# Patient Record
Sex: Female | Born: 1975 | Race: Black or African American | Hispanic: No | Marital: Married | Smoking: Never smoker
Health system: Southern US, Community
[De-identification: ages and names within clinical notes are randomized; demographics above are authoritative.]

## PROBLEM LIST (undated history)

## (undated) DIAGNOSIS — M1990 Unspecified osteoarthritis, unspecified site: Secondary | ICD-10-CM

## (undated) DIAGNOSIS — K5792 Diverticulitis of intestine, part unspecified, without perforation or abscess without bleeding: Secondary | ICD-10-CM

## (undated) DIAGNOSIS — J45909 Unspecified asthma, uncomplicated: Secondary | ICD-10-CM

## (undated) DIAGNOSIS — I10 Essential (primary) hypertension: Secondary | ICD-10-CM

## (undated) HISTORY — DX: Unspecified asthma, uncomplicated: J45.909

## (undated) HISTORY — DX: Unspecified osteoarthritis, unspecified site: M19.90

## (undated) HISTORY — DX: Diverticulitis of intestine, part unspecified, without perforation or abscess without bleeding: K57.92

---

## 1998-09-10 ENCOUNTER — Other Ambulatory Visit: Admission: RE | Admit: 1998-09-10 | Discharge: 1998-09-10 | Payer: Self-pay | Admitting: Obstetrics and Gynecology

## 1999-09-29 ENCOUNTER — Other Ambulatory Visit: Admission: RE | Admit: 1999-09-29 | Discharge: 1999-09-29 | Payer: Self-pay | Admitting: *Deleted

## 2000-10-03 ENCOUNTER — Emergency Department (HOSPITAL_COMMUNITY): Admission: EM | Admit: 2000-10-03 | Discharge: 2000-10-04 | Payer: Self-pay | Admitting: Emergency Medicine

## 2000-10-04 ENCOUNTER — Encounter: Payer: Self-pay | Admitting: Emergency Medicine

## 2000-11-07 ENCOUNTER — Other Ambulatory Visit: Admission: RE | Admit: 2000-11-07 | Discharge: 2000-11-07 | Payer: Self-pay | Admitting: *Deleted

## 2000-11-07 ENCOUNTER — Other Ambulatory Visit: Admission: RE | Admit: 2000-11-07 | Discharge: 2000-11-07 | Payer: Self-pay | Admitting: Family Medicine

## 2001-07-26 ENCOUNTER — Other Ambulatory Visit: Admission: RE | Admit: 2001-07-26 | Discharge: 2001-07-26 | Payer: Self-pay | Admitting: Obstetrics and Gynecology

## 2002-01-04 ENCOUNTER — Other Ambulatory Visit: Admission: RE | Admit: 2002-01-04 | Discharge: 2002-01-04 | Payer: Self-pay | Admitting: *Deleted

## 2003-01-31 ENCOUNTER — Other Ambulatory Visit: Admission: RE | Admit: 2003-01-31 | Discharge: 2003-01-31 | Payer: Self-pay | Admitting: *Deleted

## 2005-02-04 ENCOUNTER — Emergency Department (HOSPITAL_COMMUNITY): Admission: EM | Admit: 2005-02-04 | Discharge: 2005-02-05 | Payer: Self-pay | Admitting: Emergency Medicine

## 2005-04-08 ENCOUNTER — Encounter: Admission: RE | Admit: 2005-04-08 | Discharge: 2005-05-02 | Payer: Self-pay | Admitting: *Deleted

## 2006-05-15 ENCOUNTER — Emergency Department (HOSPITAL_COMMUNITY): Admission: EM | Admit: 2006-05-15 | Discharge: 2006-05-15 | Payer: Self-pay | Admitting: Emergency Medicine

## 2009-05-01 ENCOUNTER — Encounter: Admission: RE | Admit: 2009-05-01 | Discharge: 2009-05-01 | Payer: Self-pay | Admitting: Obstetrics and Gynecology

## 2011-04-18 ENCOUNTER — Encounter: Payer: Self-pay | Admitting: Emergency Medicine

## 2011-04-18 ENCOUNTER — Emergency Department (HOSPITAL_COMMUNITY)
Admission: EM | Admit: 2011-04-18 | Discharge: 2011-04-19 | Disposition: A | Discharge status: Home / Self Care | Payer: BC Managed Care – PPO | Attending: Emergency Medicine | Admitting: Emergency Medicine

## 2011-04-18 DIAGNOSIS — Z79899 Other long term (current) drug therapy: Secondary | ICD-10-CM | POA: Insufficient documentation

## 2011-04-18 DIAGNOSIS — R110 Nausea: Secondary | ICD-10-CM | POA: Insufficient documentation

## 2011-04-18 DIAGNOSIS — K2990 Gastroduodenitis, unspecified, without bleeding: Secondary | ICD-10-CM | POA: Insufficient documentation

## 2011-04-18 DIAGNOSIS — R109 Unspecified abdominal pain: Secondary | ICD-10-CM | POA: Insufficient documentation

## 2011-04-18 DIAGNOSIS — R10819 Abdominal tenderness, unspecified site: Secondary | ICD-10-CM | POA: Insufficient documentation

## 2011-04-18 DIAGNOSIS — R059 Cough, unspecified: Secondary | ICD-10-CM | POA: Insufficient documentation

## 2011-04-18 DIAGNOSIS — I10 Essential (primary) hypertension: Secondary | ICD-10-CM | POA: Insufficient documentation

## 2011-04-18 DIAGNOSIS — R05 Cough: Secondary | ICD-10-CM | POA: Insufficient documentation

## 2011-04-18 DIAGNOSIS — K2970 Gastritis, unspecified, without bleeding: Secondary | ICD-10-CM | POA: Insufficient documentation

## 2011-04-18 HISTORY — DX: Essential (primary) hypertension: I10

## 2011-04-18 LAB — CBC
HCT: 43.4 % (ref 36.0–46.0)
Hemoglobin: 14.5 g/dL (ref 12.0–15.0)
MCH: 30.1 pg (ref 26.0–34.0)
MCHC: 33.4 g/dL (ref 30.0–36.0)
MCV: 90 fL (ref 78.0–100.0)
Platelets: 342 10*3/uL (ref 150–400)
RBC: 4.82 MIL/uL (ref 3.87–5.11)
RDW: 13 % (ref 11.5–15.5)
WBC: 17 10*3/uL — ABNORMAL HIGH (ref 4.0–10.5)

## 2011-04-18 LAB — URINALYSIS, ROUTINE W REFLEX MICROSCOPIC
Glucose, UA: NEGATIVE mg/dL
Hgb urine dipstick: NEGATIVE
Ketones, ur: 40 mg/dL — AB
Leukocytes, UA: NEGATIVE
Nitrite: NEGATIVE
Protein, ur: 30 mg/dL — AB
Specific Gravity, Urine: 1.037 — ABNORMAL HIGH (ref 1.005–1.030)
Urobilinogen, UA: 0.2 mg/dL (ref 0.0–1.0)
pH: 5.5 (ref 5.0–8.0)

## 2011-04-18 LAB — DIFFERENTIAL
Basophils Absolute: 0 10*3/uL (ref 0.0–0.1)
Basophils Relative: 0 % (ref 0–1)
Eosinophils Absolute: 0 10*3/uL (ref 0.0–0.7)
Eosinophils Relative: 0 % (ref 0–5)
Lymphocytes Relative: 9 % — ABNORMAL LOW (ref 12–46)
Lymphs Abs: 1.5 10*3/uL (ref 0.7–4.0)
Monocytes Absolute: 0.6 10*3/uL (ref 0.1–1.0)
Monocytes Relative: 4 % (ref 3–12)
Neutro Abs: 14.9 10*3/uL — ABNORMAL HIGH (ref 1.7–7.7)
Neutrophils Relative %: 87 % — ABNORMAL HIGH (ref 43–77)

## 2011-04-18 LAB — BASIC METABOLIC PANEL
BUN: 12 mg/dL (ref 6–23)
CO2: 25 mEq/L (ref 19–32)
Calcium: 9.7 mg/dL (ref 8.4–10.5)
Chloride: 101 mEq/L (ref 96–112)
Creatinine, Ser: 0.98 mg/dL (ref 0.50–1.10)
GFR calc Af Amer: 84 mL/min — ABNORMAL LOW (ref >90)
GFR calc non Af Amer: 73 mL/min — ABNORMAL LOW (ref >90)
Glucose, Bld: 117 mg/dL — ABNORMAL HIGH (ref 70–99)
Potassium: 3.8 mEq/L (ref 3.5–5.1)
Sodium: 137 mEq/L (ref 135–145)

## 2011-04-18 LAB — URINE MICROSCOPIC-ADD ON

## 2011-04-18 LAB — POCT PREGNANCY, URINE: Preg Test, Ur: NEGATIVE

## 2011-04-18 MED ORDER — SODIUM CHLORIDE 0.9 % IV BOLUS (SEPSIS)
1000.0000 mL | Freq: Once | INTRAVENOUS | Status: AC
  Administered 2011-04-18: 1000 mL via INTRAVENOUS

## 2011-04-18 MED ORDER — SODIUM CHLORIDE 0.9 % IV SOLN: INTRAVENOUS | Status: DC

## 2011-04-18 MED ORDER — MORPHINE SULFATE 4 MG/ML IJ SOLN
4.0000 mg | Freq: Once | INTRAMUSCULAR | Status: AC
  Administered 2011-04-18: 4 mg via INTRAVENOUS
  Filled 2011-04-18: qty 1

## 2011-04-18 MED ORDER — FAMOTIDINE IN NACL 20-0.9 MG/50ML-% IV SOLN
20.0000 mg | Freq: Once | INTRAVENOUS | Status: AC
  Administered 2011-04-18: 20 mg via INTRAVENOUS
  Filled 2011-04-18: qty 50

## 2011-04-18 MED ORDER — ONDANSETRON HCL 4 MG/2ML IJ SOLN
4.0000 mg | Freq: Once | INTRAMUSCULAR | Status: AC
  Administered 2011-04-18: 4 mg via INTRAVENOUS
  Filled 2011-04-18: qty 2

## 2011-04-18 NOTE — ED Notes (Signed)
PT. REPORTS INTERMITTENT GENERALIZED ABDOMINAL PAIN /CRAMPING ONSET TODAY , DENIES NAUSEA ,VOMITTING OR DIARRHEA , NO FEVER OR CHILLS , NO DYSURIA OR HEMATURIA.

## 2011-04-19 LAB — HEPATIC FUNCTION PANEL
ALT: 8 U/L (ref 0–35)
AST: 13 U/L (ref 0–37)
Albumin: 3.5 g/dL (ref 3.5–5.2)
Alkaline Phosphatase: 58 U/L (ref 39–117)
Bilirubin, Direct: <0.1 mg/dL (ref 0.0–0.3)
Total Bilirubin: 0.5 mg/dL (ref 0.3–1.2)
Total Protein: 7.7 g/dL (ref 6.0–8.3)

## 2011-04-19 LAB — LIPASE, BLOOD: Lipase: 15 U/L (ref 11–59)

## 2011-04-19 MED ORDER — PROMETHAZINE HCL 25 MG PO TABS: 25.0000 mg | ORAL_TABLET | Freq: Three times a day (TID) | ORAL | Status: DC | PRN

## 2011-04-19 NOTE — ED Notes (Signed)
No changes, denies pain, EDP into room, family at [facility].

## 2011-04-19 NOTE — ED Provider Notes (Signed)
History     CSN: [id number] Arrival date & time: 04/18/2011  8:49 PM   First MD Initiated Contact with Patient 04/18/11 2257      Chief Complaint  Patient presents with  . Abdominal Pain    (Consider location/radiation/quality/duration/timing/severity/associated sxs/prior treatment) HPI  Patient relates about noon she started getting upper abdominal pain that's all the way across her upper abdomen that appears to be  worse on the left side. She states the pain is intermittent and lasting about a minute. She had nausea earlier not now, she denies vomiting or diarrhea. She has been having a dry cough but that is better now. she denies fever or urinary symptoms such as dysuria or frequency. She states nothing she does makes it feel better nothing, she does makes it feel worse. She denies having this discomfort before.  Primary care Dr. Claude Manges at [place] at [facility]  Past Medical History  Diagnosis Date  . Hypertension     History reviewed. No pertinent past surgical history.  History reviewed. No pertinent family history.  History  Substance Use Topics  . Smoking status: Never Smoker   . Smokeless tobacco: Not on file  . Alcohol Use: No   patient employed  OB History    Grav Para Term Preterm Abortions TAB SAB Ect Mult Living                  Review of Systems  All other systems reviewed and are negative.    Allergies  Review of patient's allergies indicates no known allergies.  Home Medications   Current Outpatient Rx  Name Route Sig Dispense Refill  . DROSPIRENONE-ETHINYL ESTRADIOL 3-0.03 MG PO TABS Oral Take 1 tablet by mouth daily.      Marland Kitchen LISINOPRIL 20 MG PO TABS Oral Take 20 mg by mouth daily.      Marland Kitchen OVER THE COUNTER MEDICATION Oral Take 1 tablet by mouth daily as needed. For upset stomach     . PHAZYME PO Oral Take 1 tablet by mouth daily as needed. For gas       BP 133/87  Pulse 90  Temp(Src) 98.7 F (37.1 C) (Oral)  Resp 18  SpO2 99%   LMP 04/10/2011 Vital signs normal  Physical Exam  Nursing note and vitals reviewed. Constitutional: She is oriented to person, place, and time. She appears well-developed and well-nourished.  Non-toxic appearance. She does not appear ill. No distress.  HENT:  Head: Normocephalic and atraumatic.  Right Ear: External ear normal.  Left Ear: External ear normal.  Nose: Nose normal. No mucosal edema or rhinorrhea.  Mouth/Throat: Oropharynx is clear and moist and mucous membranes are normal. No dental abscesses or uvula swelling.  Eyes: Conjunctivae and EOM are normal. Pupils are equal, round, and reactive to light.  Neck: Normal range of motion and full passive range of motion without pain. Neck supple.  Cardiovascular: Normal rate, regular rhythm and normal heart sounds.  Exam reveals no gallop and no friction rub.   No murmur heard. Pulmonary/Chest: Effort normal and breath sounds normal. No respiratory distress. She has no wheezes. She has no rhonchi. She has no rales. She exhibits no tenderness and no crepitus.  Abdominal: Soft. Normal appearance and bowel sounds are normal. She exhibits no distension. There is tenderness. There is no rebound and no guarding.       Patient has  diffuse tenderness to palpation of her upper abdomen there is no guarding or rebound she does indicate  he seems to be worse on the left side.  Musculoskeletal: Normal range of motion. She exhibits no edema and no tenderness.       Moves all extremities well.   Neurological: She is alert and oriented to person, place, and time. She has normal strength. No cranial nerve deficit.  Skin: Skin is warm, dry and intact. No rash noted. No erythema. No pallor.  Psychiatric: She has a normal mood and affect. Her speech is normal and behavior is normal. Her mood appears not anxious.    ED Course  Procedures (including critical care time)  Patient given IV fluids, IV pain and nausea medicine. Recheck at 02 10 she states she  feels improved infiltrate at home.   Medications  0.9 %  sodium chloride infusion (not administered)  sodium chloride 0.9 % bolus 1,000 mL (1000 mL Intravenous Given 04/18/11 2340)  famotidine (PEPCID) IVPB 20 mg (20 mg Intravenous Given 04/18/11 2345)  morphine 4 MG/ML injection 4 mg (4 mg Intravenous Given 04/18/11 2344)  ondansetron (ZOFRAN) injection 4 mg (4 mg Intravenous Given 04/18/11 2341)     Results for orders placed during the hospital encounter of 04/18/11  CBC      Component Value Range   WBC 17.0 (*) 4.0 - 10.5 (K/uL)   RBC 4.82  3.87 - 5.11 (MIL/uL)   Hemoglobin 14.5  12.0 - 15.0 (g/dL)   HCT 62.1  30.8 - 65.7 (%)   MCV 90.0  78.0 - 100.0 (fL)   MCH 30.1  26.0 - 34.0 (pg)   MCHC 33.4  30.0 - 36.0 (g/dL)   RDW 84.6  96.2 - 95.2 (%)   Platelets 342  150 - 400 (K/uL)  DIFFERENTIAL      Component Value Range   Neutrophils Relative 87 (*) 43 - 77 (%)   Neutro Abs 14.9 (*) 1.7 - 7.7 (K/uL)   Lymphocytes Relative 9 (*) 12 - 46 (%)   Lymphs Abs 1.5  0.7 - 4.0 (K/uL)   Monocytes Relative 4  3 - 12 (%)   Monocytes Absolute 0.6  0.1 - 1.0 (K/uL)   Eosinophils Relative 0  0 - 5 (%)   Eosinophils Absolute 0.0  0.0 - 0.7 (K/uL)   Basophils Relative 0  0 - 1 (%)   Basophils Absolute 0.0  0.0 - 0.1 (K/uL)  BASIC METABOLIC PANEL      Component Value Range   Sodium 137  135 - 145 (mEq/L)   Potassium 3.8  3.5 - 5.1 (mEq/L)   Chloride 101  96 - 112 (mEq/L)   CO2 25  19 - 32 (mEq/L)   Glucose, Bld 117 (*) 70 - 99 (mg/dL)   BUN 12  6 - 23 (mg/dL)   Creatinine, Ser 8.41  0.50 - 1.10 (mg/dL)   Calcium 9.7  8.4 - 32.4 (mg/dL)   GFR calc non Af Amer 73 (*) >90 (mL/min)   GFR calc Af Amer 84 (*) >90 (mL/min)  URINALYSIS, ROUTINE W REFLEX MICROSCOPIC      Component Value Range   Color, Urine AMBER (*) YELLOW    APPearance CLOUDY (*) CLEAR    Specific Gravity, Urine 1.037 (*) 1.005 - 1.030    pH 5.5  5.0 - 8.0    Glucose, UA NEGATIVE  NEGATIVE (mg/dL)   Hgb urine dipstick  NEGATIVE  NEGATIVE    Bilirubin Urine SMALL (*) NEGATIVE    Ketones, ur 40 (*) NEGATIVE (mg/dL)   Protein, ur 30 (*) NEGATIVE (mg/dL)  Urobilinogen, UA 0.2  0.0 - 1.0 (mg/dL)   Nitrite NEGATIVE  NEGATIVE    Leukocytes, UA NEGATIVE  NEGATIVE   HEPATIC FUNCTION PANEL      Component Value Range   Total Protein 7.7  6.0 - 8.3 (g/dL)   Albumin 3.5  3.5 - 5.2 (g/dL)   AST 13  0 - 37 (U/L)   ALT 8  0 - 35 (U/L)   Alkaline Phosphatase 58  39 - 117 (U/L)   Total Bilirubin 0.5  0.3 - 1.2 (mg/dL)   Bilirubin, Direct <5.6  0.0 - 0.3 (mg/dL)   Indirect Bilirubin NOT CALCULATED  0.3 - 0.9 (mg/dL)  POCT PREGNANCY, URINE      Component Value Range   Preg Test, Ur NEGATIVE    URINE MICROSCOPIC-ADD ON      Component Value Range   Squamous Epithelial / LPF FEW (*) RARE    WBC, UA 3-6  <3 (WBC/hpf)   RBC / HPF 0-2  <3 (RBC/hpf)   Bacteria, UA RARE  RARE    Urine-Other MUCOUS PRESENT    LIPASE, BLOOD      Component Value Range   Lipase 15  11 - 59 (U/L)   Laboratory interpretation leukocytosis otherwise no significant changes    Diagnoses that have been ruled out:  Diagnoses that are still under consideration:  Final diagnoses:  Gastritis   New Prescriptions   PROMETHAZINE (PHENERGAN) 25 MG TABLET    Take 1 tablet (25 mg total) by mouth every 8 (eight) hours as needed for nausea.  Prilosec OTC  Plan discharge  Devoria Albe, MD, FACEP    No diagnosis found.    MDM          Ward Givens, MD 04/19/11 [phone]

## 2011-04-25 ENCOUNTER — Emergency Department (HOSPITAL_COMMUNITY): Payer: BC Managed Care – PPO

## 2011-04-25 ENCOUNTER — Inpatient Hospital Stay (HOSPITAL_COMMUNITY)
Admission: EM | Admit: 2011-04-25 | Discharge: 2011-04-26 | DRG: 814 | Disposition: A | Discharge status: Home / Self Care | Payer: BC Managed Care – PPO | Attending: Internal Medicine | Admitting: Internal Medicine

## 2011-04-25 ENCOUNTER — Encounter (HOSPITAL_COMMUNITY): Payer: Self-pay

## 2011-04-25 DIAGNOSIS — E860 Dehydration: Secondary | ICD-10-CM | POA: Diagnosis present

## 2011-04-25 DIAGNOSIS — I10 Essential (primary) hypertension: Secondary | ICD-10-CM | POA: Diagnosis present

## 2011-04-25 DIAGNOSIS — A09 Infectious gastroenteritis and colitis, unspecified: Principal | ICD-10-CM | POA: Diagnosis present

## 2011-04-25 DIAGNOSIS — D72829 Elevated white blood cell count, unspecified: Secondary | ICD-10-CM | POA: Diagnosis present

## 2011-04-25 DIAGNOSIS — K529 Noninfective gastroenteritis and colitis, unspecified: Secondary | ICD-10-CM | POA: Diagnosis present

## 2011-04-25 LAB — COMPREHENSIVE METABOLIC PANEL
ALT: 19 U/L (ref 0–35)
AST: 12 U/L (ref 0–37)
Albumin: 3.5 g/dL (ref 3.5–5.2)
Alkaline Phosphatase: 57 U/L (ref 39–117)
BUN: 10 mg/dL (ref 6–23)
CO2: 22 mEq/L (ref 19–32)
Calcium: 9.5 mg/dL (ref 8.4–10.5)
Chloride: 101 mEq/L (ref 96–112)
Creatinine, Ser: 0.92 mg/dL (ref 0.50–1.10)
GFR calc Af Amer: >90 mL/min (ref >90)
GFR calc non Af Amer: 79 mL/min — ABNORMAL LOW (ref >90)
Glucose, Bld: 146 mg/dL — ABNORMAL HIGH (ref 70–99)
Potassium: 3.8 mEq/L (ref 3.5–5.1)
Sodium: 134 mEq/L — ABNORMAL LOW (ref 135–145)
Total Bilirubin: 0.4 mg/dL (ref 0.3–1.2)
Total Protein: 7.7 g/dL (ref 6.0–8.3)

## 2011-04-25 LAB — CBC
HCT: 42.3 % (ref 36.0–46.0)
Hemoglobin: 14.4 g/dL (ref 12.0–15.0)
MCH: 30.1 pg (ref 26.0–34.0)
MCHC: 34 g/dL (ref 30.0–36.0)
MCV: 88.3 fL (ref 78.0–100.0)
Platelets: 332 10*3/uL (ref 150–400)
RBC: 4.79 MIL/uL (ref 3.87–5.11)
RDW: 12.6 % (ref 11.5–15.5)
WBC: 15.9 10*3/uL — ABNORMAL HIGH (ref 4.0–10.5)

## 2011-04-25 LAB — URINALYSIS, ROUTINE W REFLEX MICROSCOPIC
Bilirubin Urine: NEGATIVE
Glucose, UA: NEGATIVE mg/dL
Hgb urine dipstick: NEGATIVE
Ketones, ur: 15 mg/dL — AB
Leukocytes, UA: NEGATIVE
Nitrite: NEGATIVE
Protein, ur: NEGATIVE mg/dL
Specific Gravity, Urine: 1.02 (ref 1.005–1.030)
Urobilinogen, UA: 0.2 mg/dL (ref 0.0–1.0)
pH: 5 (ref 5.0–8.0)

## 2011-04-25 LAB — DIFFERENTIAL
Basophils Absolute: 0 10*3/uL (ref 0.0–0.1)
Basophils Relative: 0 % (ref 0–1)
Eosinophils Absolute: 0 10*3/uL (ref 0.0–0.7)
Eosinophils Relative: 0 % (ref 0–5)
Lymphocytes Relative: 5 % — ABNORMAL LOW (ref 12–46)
Lymphs Abs: 0.9 10*3/uL (ref 0.7–4.0)
Monocytes Absolute: 0.5 10*3/uL (ref 0.1–1.0)
Monocytes Relative: 3 % (ref 3–12)
Neutro Abs: 14.5 10*3/uL — ABNORMAL HIGH (ref 1.7–7.7)
Neutrophils Relative %: 91 % — ABNORMAL HIGH (ref 43–77)

## 2011-04-25 LAB — LIPASE, BLOOD: Lipase: 15 U/L (ref 11–59)

## 2011-04-25 LAB — LACTIC ACID, PLASMA: Lactic Acid, Venous: 1.2 mmol/L (ref 0.5–2.2)

## 2011-04-25 LAB — POCT PREGNANCY, URINE: Preg Test, Ur: NEGATIVE

## 2011-04-25 MED ORDER — CIPROFLOXACIN IN D5W 400 MG/200ML IV SOLN
400.0000 mg | Freq: Two times a day (BID) | INTRAVENOUS | Status: DC
  Administered 2011-04-25 – 2011-04-26 (×3): 400 mg via INTRAVENOUS
  Filled 2011-04-25 (×4): qty 200

## 2011-04-25 MED ORDER — OXYCODONE HCL 5 MG PO TABS
5.0000 mg | ORAL_TABLET | ORAL | Status: DC | PRN
  Administered 2011-04-26: 5 mg via ORAL
  Filled 2011-04-25 (×2): qty 1

## 2011-04-25 MED ORDER — IOHEXOL 300 MG/ML  SOLN
20.0000 mL | INTRAMUSCULAR | Status: AC
  Administered 2011-04-25: 20 mL via ORAL

## 2011-04-25 MED ORDER — ONDANSETRON HCL 4 MG/2ML IJ SOLN
4.0000 mg | Freq: Once | INTRAMUSCULAR | Status: AC
  Administered 2011-04-25: 4 mg via INTRAVENOUS
  Filled 2011-04-25: qty 2

## 2011-04-25 MED ORDER — HYDROMORPHONE HCL PF 1 MG/ML IJ SOLN
1.0000 mg | Freq: Once | INTRAMUSCULAR | Status: AC
  Administered 2011-04-25: 1 mg via INTRAVENOUS
  Filled 2011-04-25: qty 1

## 2011-04-25 MED ORDER — SODIUM CHLORIDE 0.9 % IJ SOLN
3.0000 mL | Freq: Two times a day (BID) | INTRAMUSCULAR | Status: DC
  Administered 2011-04-25 – 2011-04-26 (×2): 3 mL via INTRAVENOUS

## 2011-04-25 MED ORDER — OMEPRAZOLE MAGNESIUM 20 MG PO TBEC
20.0000 mg | DELAYED_RELEASE_TABLET | Freq: Every day | ORAL | Status: DC
  Administered 2011-04-25 – 2011-04-26 (×2): 20 mg via ORAL
  Filled 2011-04-25 (×2): qty 1

## 2011-04-25 MED ORDER — ONDANSETRON HCL 4 MG/2ML IJ SOLN
4.0000 mg | Freq: Four times a day (QID) | INTRAMUSCULAR | Status: DC | PRN
  Administered 2011-04-26: 4 mg via INTRAVENOUS
  Filled 2011-04-25: qty 2

## 2011-04-25 MED ORDER — LISINOPRIL 20 MG PO TABS
20.0000 mg | ORAL_TABLET | Freq: Every day | ORAL | Status: DC
Start: 2011-04-25 — End: 2011-04-26
  Administered 2011-04-25 – 2011-04-26 (×2): 20 mg via ORAL
  Filled 2011-04-25 (×2): qty 1

## 2011-04-25 MED ORDER — METRONIDAZOLE IN NACL 5-0.79 MG/ML-% IV SOLN
500.0000 mg | Freq: Three times a day (TID) | INTRAVENOUS | Status: DC
  Filled 2011-04-25 (×2): qty 100

## 2011-04-25 MED ORDER — IOHEXOL 300 MG/ML  SOLN
100.0000 mL | Freq: Once | INTRAMUSCULAR | Status: AC | PRN
  Administered 2011-04-25: 100 mL via INTRAVENOUS

## 2011-04-25 MED ORDER — SODIUM CHLORIDE 0.9 % IV BOLUS (SEPSIS)
1000.0000 mL | Freq: Once | INTRAVENOUS | Status: AC
  Administered 2011-04-25: 1000 mL via INTRAVENOUS

## 2011-04-25 MED ORDER — METRONIDAZOLE IN NACL 5-0.79 MG/ML-% IV SOLN
500.0000 mg | Freq: Three times a day (TID) | INTRAVENOUS | Status: DC
  Administered 2011-04-25 – 2011-04-26 (×4): 500 mg via INTRAVENOUS
  Filled 2011-04-25 (×6): qty 100

## 2011-04-25 MED ORDER — ONDANSETRON HCL 4 MG/2ML IJ SOLN
4.0000 mg | Freq: Once | INTRAMUSCULAR | Status: AC
  Administered 2011-04-25: 4 mg via INTRAVENOUS

## 2011-04-25 MED ORDER — ONDANSETRON HCL 4 MG/2ML IJ SOLN
4.0000 mg | Freq: Once | INTRAMUSCULAR | Status: DC
  Filled 2011-04-25: qty 2

## 2011-04-25 MED ORDER — SODIUM CHLORIDE 0.9 % IV SOLN: INTRAVENOUS | Status: DC

## 2011-04-25 MED ORDER — SODIUM CHLORIDE 0.9 % IJ SOLN: 3.0000 mL | INTRAMUSCULAR | Status: DC | PRN

## 2011-04-25 MED ORDER — SODIUM CHLORIDE 0.9 % IV SOLN: 250.0000 mL | INTRAVENOUS | Status: DC | PRN

## 2011-04-25 MED ORDER — ONDANSETRON HCL 4 MG PO TABS: 4.0000 mg | ORAL_TABLET | Freq: Four times a day (QID) | ORAL | Status: DC | PRN

## 2011-04-25 MED ORDER — SODIUM CHLORIDE 0.9 % IV SOLN
Freq: Once | INTRAVENOUS | Status: AC
  Administered 2011-04-25: 08:00:00 via INTRAVENOUS

## 2011-04-25 NOTE — H&P (Signed)
PCP:  OWENS,REBECCA, FNP, FNP   DOA:  04/25/2011  3:06 AM  Chief Complaint:  abdominal pain with vomiting and diarrhea x 1 day  HPI: 35 year old female with a past medical history all for recently diagnosed hypertension who presented to the ED one week back with left mid quadrant abdominal pain of one day duration. She was given pain medications in the ED and was sent home. Was noted to have moderate leukocytosis at that time. She then went to an urgent care the next day and was informed to call PCP if symptoms persisted. He informs that her symptoms subsided however 2 days back she started having similar sharp abdominal pain. Since yesterday pain started getting worse and was having it several times during the day lasting for a few minutes and was radiating all across the abdomen. She also had 2 episodes of vomiting of food particles and 4 episodes of watery diarrhea without any blood. She denies any sick contacts, denies eating anything outside, denies being on antibiotics recently, denies recent travel, denies fever or chills, denies sick contacts , denies joint pains, no headache or blurring of vision. He denies having any similar symptoms in the past. She denies any family history all for intermittent rebound disease. The ED she was noted to be tachycardic and hypertensive and as per the ED physician she was quite tender on palpation over the abdomen. CT scan of the abdomen and pelvis in the ED showed significant wall thickening within the proximal and mid small bowel loops with mucosal enhancement, mesenteric edema and free fluid suggestive off infectious versus intermittently enteritis.  Allergies: No Known Allergies  Prior to Admission medications   Medication Sig Start Date End Date Taking? Authorizing Provider  drospirenone-ethinyl estradiol (YASMIN,ZARAH,SYEDA) 3-0.03 MG tablet Take 1 tablet by mouth daily.     Yes Historical Provider, MD  lisinopril (PRINIVIL,ZESTRIL) 20 MG tablet Take  20 mg by mouth daily.     Yes Historical Provider, MD  omeprazole (PRILOSEC OTC) 20 MG tablet Take 20 mg by mouth daily.     Yes Historical Provider, MD    Past Medical History  Diagnosis Date  . Hypertension     History reviewed. No pertinent past surgical history.  Social History:  reports that she has never smoked.  She reports that she does not drink alcohol or use illicit drugs.  No family history on file.  Review of Systems:  Constitutional: Denies fever, chills, diaphoresis, appetite change and fatigue.  HEENT: Denies photophobia, eye pain, redness, hearing loss, ear pain, congestion, sore throat, rhinorrhea, sneezing, mouth sores, trouble swallowing, neck pain, neck stiffness and tinnitus.   Respiratory: Denies SOB, DOE, cough, chest tightness,  and wheezing.   Cardiovascular: Denies chest pain, palpitations and leg swelling.  Gastrointestinal: Positive for nausea, vomiting, abdominal pain, diarrhea, denies constipation, denies blood in stool  Genitourinary: Denies dysuria, urgency, frequency, hematuria, flank pain and difficulty urinating.  Musculoskeletal: Denies myalgias, back pain, joint swelling, arthralgias and gait problem.  Skin: Denies pallor, rash and wound.  Neurological: Denies dizziness, seizures, syncope, weakness, light-headedness, numbness and headaches.  Hematological: Denies adenopathy. Easy bruising, personal or family bleeding history  Psychiatric/Behavioral: Denies suicidal ideation, mood changes, confusion, nervousness, sleep disturbance and agitation   Physical Exam:  Filed Vitals:   04/25/11 0450 04/25/11 0652 04/25/11 0930 04/25/11 0945  BP: 159/118 160/117 144/98 140/93  Pulse:   85 89  Temp: 98.7 F (37.1 C) 98.5 F (36.9 C)  97.8 F (36.6 C)  TempSrc:  Oral Oral    Resp: 18 20    SpO2: 100% 98% 99% 99%    Constitutional: Middle-aged female lying in bed in no acute distress  HEENT: No pallor, no icterus, dry oral mucosa, no  lymphadenopathy Cardiovascular: RRR, S1 normal, S2 normal, no MRG, pulses symmetric and intact bilaterally Pulmonary/Chest: CTAB, no wheezes, rales, or rhonchi Abdominal: Soft. , non-distended, bowel sounds are normal, tender to deep palpation over left mid quadrant, no masses, organomegaly, or guarding present.  GU: no CVA tenderness Musculoskeletal: No joint deformities, erythema, or stiffness, ROM full and no nontender Ext: no edema and no cyanosis, pulses palpable bilaterally (DP and PT) Hematology: no cervical, inginal, or axillary adenopathy.  Neurological: A&O x3, Strenght is normal and symmetric bilaterally, cranial nerve II-XII are grossly intact, no focal motor deficit, sensory intact to light touch bilaterally.  Skin: Warm, dry and intact. No rash, cyanosis, or clubbing.  Psychiatric: Normal mood and affect. speech and behavior is normal. Judgment and thought content normal. Cognition and memory are normal.   Labs on Admission:  Results for orders placed during the hospital encounter of 04/25/11 (from the past 48 hour(s))  CBC     Status: Abnormal   Collection Time   04/25/11  5:50 AM      Component Value Range Comment   WBC 15.9 (*) 4.0 - 10.5 (K/uL)    RBC 4.79  3.87 - 5.11 (MIL/uL)    Hemoglobin 14.4  12.0 - 15.0 (g/dL)    HCT 16.1  09.6 - 04.5 (%)    MCV 88.3  78.0 - 100.0 (fL)    MCH 30.1  26.0 - 34.0 (pg)    MCHC 34.0  30.0 - 36.0 (g/dL)    RDW 40.9  81.1 - 91.4 (%)    Platelets 332  150 - 400 (K/uL)   DIFFERENTIAL     Status: Abnormal   Collection Time   04/25/11  5:50 AM      Component Value Range Comment   Neutrophils Relative 91 (*) 43 - 77 (%)    Neutro Abs 14.5 (*) 1.7 - 7.7 (K/uL)    Lymphocytes Relative 5 (*) 12 - 46 (%)    Lymphs Abs 0.9  0.7 - 4.0 (K/uL)    Monocytes Relative 3  3 - 12 (%)    Monocytes Absolute 0.5  0.1 - 1.0 (K/uL)    Eosinophils Relative 0  0 - 5 (%)    Eosinophils Absolute 0.0  0.0 - 0.7 (K/uL)    Basophils Relative 0  0 - 1 (%)      Basophils Absolute 0.0  0.0 - 0.1 (K/uL)   COMPREHENSIVE METABOLIC PANEL     Status: Abnormal   Collection Time   04/25/11  5:50 AM      Component Value Range Comment   Sodium 134 (*) 135 - 145 (mEq/L)    Potassium 3.8  3.5 - 5.1 (mEq/L)    Chloride 101  96 - 112 (mEq/L)    CO2 22  19 - 32 (mEq/L)    Glucose, Bld 146 (*) 70 - 99 (mg/dL)    BUN 10  6 - 23 (mg/dL)    Creatinine, Ser 7.82  0.50 - 1.10 (mg/dL)    Calcium 9.5  8.4 - 10.5 (mg/dL)    Total Protein 7.7  6.0 - 8.3 (g/dL)    Albumin 3.5  3.5 - 5.2 (g/dL)    AST 12  0 - 37 (U/L)    ALT 19  0 - 35 (U/L)    Alkaline Phosphatase 57  39 - 117 (U/L)    Total Bilirubin 0.4  0.3 - 1.2 (mg/dL)    GFR calc non Af Amer 79 (*) >90 (mL/min)    GFR calc Af Amer >90  >90 (mL/min)   LIPASE, BLOOD     Status: Normal   Collection Time   04/25/11  5:50 AM      Component Value Range Comment   Lipase 15  11 - 59 (U/L)   URINALYSIS, ROUTINE W REFLEX MICROSCOPIC     Status: Abnormal   Collection Time   04/25/11  5:57 AM      Component Value Range Comment   Color, Urine YELLOW  YELLOW     APPearance CLEAR  CLEAR     Specific Gravity, Urine 1.020  1.005 - 1.030     pH 5.0  5.0 - 8.0     Glucose, UA NEGATIVE  NEGATIVE (mg/dL)    Hgb urine dipstick NEGATIVE  NEGATIVE     Bilirubin Urine NEGATIVE  NEGATIVE     Ketones, ur 15 (*) NEGATIVE (mg/dL)    Protein, ur NEGATIVE  NEGATIVE (mg/dL)    Urobilinogen, UA 0.2  0.0 - 1.0 (mg/dL)    Nitrite NEGATIVE  NEGATIVE     Leukocytes, UA NEGATIVE  NEGATIVE  MICROSCOPIC NOT DONE ON URINES WITH NEGATIVE PROTEIN, BLOOD, LEUKOCYTES, NITRITE, OR GLUCOSE <1000 mg/dL.  POCT PREGNANCY, URINE     Status: Normal   Collection Time   04/25/11  6:04 AM      Component Value Range Comment   Preg Test, Ur NEGATIVE     LACTIC ACID, PLASMA     Status: Normal   Collection Time   04/25/11  8:40 AM      Component Value Range Comment   Lactic Acid, Venous 1.2  0.5 - 2.2 (mmol/L)     Radiological Exams on  Admission: No results found.  Assessment/Plan 35 year old female with history of  hypertension presented  with symptoms  of abdominal pain associated with nausea and vomiting and watery diarrhea since 1 day (was having abdominal pain for one week] with CT been showing thickening off small loops with mesenteric edema suggestive of infectious versus intermittently enteritis   PLAN:  infectious vs inflammatory enteritis Will admit to medical floor  IV hydration with NS Serial abdominal exam Will treat empirically with IV cipro and flagyl  send stool for leucocytes, culture and cdiff Prn zofran Monitor leucocytosis Lactate and lipase negative Discussed with Gi Dr Ronney Asters over the phone. He will likely see the patient today.   Hypertension  patient hypertensive on presentation likely due to pain  Cont home meds  Diet: clears , advance as tolerated   Time Spent on Admission: 45 minutes  Rubel Heckard 04/25/2011, 12:37 PM

## 2011-04-25 NOTE — ED Notes (Signed)
Patient reports abdominal pain states was seen here on Monday for the same and told she had an inflamation of her stomach lining. States felt better during the week but today the pain returned. Denies nausea vomiting but had one episode of diarrhea earlier this evening

## 2011-04-25 NOTE — ED Notes (Signed)
Pt was seen here one week ago; pain had resolved at one point but now pain has returned and pt now has n/v

## 2011-04-25 NOTE — ED Provider Notes (Signed)
History     CSN: [id number]  Arrival date & time 04/25/11  0249   First MD Initiated Contact with Patient 04/25/11 [phone]      Chief Complaint  Patient presents with  . Abdominal Pain    (Consider location/radiation/quality/duration/timing/severity/associated sxs/prior treatment) Patient is a 35 y.o. female presenting with abdominal pain. The history is provided by the patient.  Abdominal Pain The primary symptoms of the illness include abdominal pain.   she had been in the emergency department on December 16 with lower abdominal pain and was sent home after her blood work and urine testing has been done. Pain improved over the next several days and actually went away 3 days ago. Then, last night, she had recurrence of pain across her lower abdomen. Pain is sharp and crampy and without radiation. She states the pain is severe and rated at 10 out of 10. Nothing makes it better and nothing makes it worse. There is associated nausea, but no vomiting she has not had any constipation or diarrhea. She denies any urinary symptoms or vaginal discharge. She is on birth control pills and last menses was December 8 and was normal.  Past Medical History  Diagnosis Date  . Hypertension     History reviewed. No pertinent past surgical history.  No family history on file.  History  Substance Use Topics  . Smoking status: Never Smoker   . Smokeless tobacco: Not on file  . Alcohol Use: No    OB History    Grav Para Term Preterm Abortions TAB SAB Ect Mult Living                  Review of Systems  Gastrointestinal: Positive for abdominal pain.  All other systems reviewed and are negative.    Allergies  Review of patient's allergies indicates no known allergies.  Home Medications   Current Outpatient Rx  Name Route Sig Dispense Refill  . DROSPIRENONE-ETHINYL ESTRADIOL 3-0.03 MG PO TABS Oral Take 1 tablet by mouth daily.      Marland Kitchen LISINOPRIL 20 MG PO TABS Oral Take 20 mg by mouth  daily.      Marland Kitchen OMEPRAZOLE MAGNESIUM 20 MG PO TBEC Oral Take 20 mg by mouth daily.        BP 159/118  Pulse 126  Temp(Src) 98.7 F (37.1 C) (Oral)  Resp 18  SpO2 100%  LMP 04/10/2011  Physical Exam  Nursing note and vitals reviewed.  35 year old female who is resting comfortably and in no acute distress. Vital signs are significant for hypertension with blood pressure 159/118 and tachycardia with heart rate of 126. Head is normocephalic and atraumatic. PERRLA, EOMI. Neck is clear. Neck is supple without adenopathy or JVD. Lungs are clear. Back is nontender and there is no CVA tenderness. Heart has regular rate and rhythm without murmur. Abdomen is soft with moderate tenderness in the left lower quadrant. This tenderness is clearly out of the pelvis. There is absolutely no suprapubic tenderness. There is no rebound or guarding. Peristalsis is diminished. Extremities have no cyanosis or edema, full range of motion present. Skin is warm and moist without rash. Neurologic: Mental status is normal, cranial nerves are intact, there no focal motor or sensory deficits.  ED Course  Procedures (including critical care time)   Labs Reviewed  CBC  DIFFERENTIAL  COMPREHENSIVE METABOLIC PANEL  LIPASE, BLOOD  URINALYSIS, ROUTINE W REFLEX MICROSCOPIC  POCT PREGNANCY, URINE   No results found.  Results for orders placed during  the hospital encounter of 04/25/11  CBC      Component Value Range   WBC 15.9 (*) 4.0 - 10.5 (K/uL)   RBC 4.79  3.87 - 5.11 (MIL/uL)   Hemoglobin 14.4  12.0 - 15.0 (g/dL)   HCT 16.1  09.6 - 04.5 (%)   MCV 88.3  78.0 - 100.0 (fL)   MCH 30.1  26.0 - 34.0 (pg)   MCHC 34.0  30.0 - 36.0 (g/dL)   RDW 40.9  81.1 - 91.4 (%)   Platelets 332  150 - 400 (K/uL)  DIFFERENTIAL      Component Value Range   Neutrophils Relative 91 (*) 43 - 77 (%)   Neutro Abs 14.5 (*) 1.7 - 7.7 (K/uL)   Lymphocytes Relative 5 (*) 12 - 46 (%)   Lymphs Abs 0.9  0.7 - 4.0 (K/uL)   Monocytes Relative  3  3 - 12 (%)   Monocytes Absolute 0.5  0.1 - 1.0 (K/uL)   Eosinophils Relative 0  0 - 5 (%)   Eosinophils Absolute 0.0  0.0 - 0.7 (K/uL)   Basophils Relative 0  0 - 1 (%)   Basophils Absolute 0.0  0.0 - 0.1 (K/uL)  COMPREHENSIVE METABOLIC PANEL      Component Value Range   Sodium 134 (*) 135 - 145 (mEq/L)   Potassium 3.8  3.5 - 5.1 (mEq/L)   Chloride 101  96 - 112 (mEq/L)   CO2 22  19 - 32 (mEq/L)   Glucose, Bld 146 (*) 70 - 99 (mg/dL)   BUN 10  6 - 23 (mg/dL)   Creatinine, Ser 7.82  0.50 - 1.10 (mg/dL)   Calcium 9.5  8.4 - 95.6 (mg/dL)   Total Protein 7.7  6.0 - 8.3 (g/dL)   Albumin 3.5  3.5 - 5.2 (g/dL)   AST 12  0 - 37 (U/L)   ALT 19  0 - 35 (U/L)   Alkaline Phosphatase 57  39 - 117 (U/L)   Total Bilirubin 0.4  0.3 - 1.2 (mg/dL)   GFR calc non Af Amer 79 (*) >90 (mL/min)   GFR calc Af Amer >90  >90 (mL/min)  LIPASE, BLOOD      Component Value Range   Lipase 15  11 - 59 (U/L)  URINALYSIS, ROUTINE W REFLEX MICROSCOPIC      Component Value Range   Color, Urine YELLOW  YELLOW    APPearance CLEAR  CLEAR    Specific Gravity, Urine 1.020  1.005 - 1.030    pH 5.0  5.0 - 8.0    Glucose, UA NEGATIVE  NEGATIVE (mg/dL)   Hgb urine dipstick NEGATIVE  NEGATIVE    Bilirubin Urine NEGATIVE  NEGATIVE    Ketones, ur 15 (*) NEGATIVE (mg/dL)   Protein, ur NEGATIVE  NEGATIVE (mg/dL)   Urobilinogen, UA 0.2  0.0 - 1.0 (mg/dL)   Nitrite NEGATIVE  NEGATIVE    Leukocytes, UA NEGATIVE  NEGATIVE   POCT PREGNANCY, URINE      Component Value Range   Preg Test, Ur NEGATIVE     Ct Abdomen Pelvis W Contrast  04/25/2011  *RADIOLOGY REPORT*  Clinical Data: Abdominal pain.  CT ABDOMEN AND PELVIS WITH CONTRAST  Technique:  Multidetector CT imaging of the abdomen and pelvis was performed following the standard protocol during bolus administration of intravenous contrast.  Contrast: 100 ml Omnipaque 300 IV.  Comparison: None.  Findings: Minimal dependent atelectasis in the lung bases.  Heart is normal  size.  No  effusions.  Markedly abnormal proximal and mid small bowel loops with wall thickening, significant mucosal enhancement, and mesenteric edema. Findings compatible with infectious or inflammatory enteritis. Distal small bowel is decompressed and unremarkable.  Colon is grossly unremarkable.  There is a small amount of perihepatic, perisplenic and pelvic free fluid.  Shotty retroperitoneal lymph nodes, none pathologically enlarged.  Uterus, adnexa, urinary bladder are unremarkable.  Appendix visualized and is normal. Liver, gallbladder, spleen, pancreas, adrenals and kidneys are normal.  Stomach is normal.  IMPRESSION: Significant wall thickening within the proximal and mid small bowel loops with mucosal enhancement, mesenteric edema and free fluid. Findings compatible with infectious or inflammatory enteritis.  These results were discussed with Dr. Preston Fleeting at the time of interpretation.  Original Report Authenticated By: Cyndie Chime, M.D.     No diagnosis found.  She was given intravenous dye lauded with good relief of pain. I have reviewed the CT scan and discussed it with the radiologist. She shows significant small bowel enteritis with free fluid being seen in the perihepatic, perisplenic, and perirectal areas. In light of the degree of inflammation and her WBC, it is decided to admit her for initial IV antibiotics as well as IV hydration. Of note, her heart rate has come down to 102 with IV hydration. Case is discussed with Dr. Gonzella Lex of [facility] who agrees to admit the patient.  MDM  Recurrent abdominal pain of uncertain cause. Because symptoms have recurred, as she will have a more involved evaluation. I am concerned about possibility of diverticulitis.        Dione Booze, MD 04/25/11 [phone]

## 2011-04-25 NOTE — Consult Note (Signed)
Referring Provider: Dr. Gonzella Lex Primary Care Physician:  Claude Manges, FNP, FNP Primary Gastroenterologist:  None   Reason for Consultation:  Abdominal pain with abnormal CT of small bowel  HPI: Lynn Reeves is a 35 y.o. female no prior significant GI history.   About a week ago, she had several days of increasingly severe upper abdominal discomfort, prompting her to go to the emergency room where her liver chemistries were normal and she was released.   Over the next couple of days, the pain improved and she was essentially asymptomatic for several days until about 24 hours prior to admission, when she had the recurrence of similar pain. It is located primarily in the upper and midabdomen. It was not excruciating or totally incapacitating, but was more severe than a week earlier.    It then became associated with several episodes of nonbloody emesis and several episodes of progressively looser but nonbloody diarrhea.   Because of these symptoms, she came back to the emergency room and was found to have an elevated white count. A CT of the abdomen was obtained which showed significant thickening of the small bowel.there was also quite a bit of mesenteric stranding, and the radiographic impression was enteritis. I have reviewed the CT scan, and there is no evidence of vascular occlusion to suggest small bowel ischemia.  Since admission to the hospital, almost 24 hours ago, the patient has had no further nausea and vomiting, nor any further diarrhea, and she is tolerating moderate amounts of clear liquids without difficulty. Her abdominal discomfort is improved, roughly about the same as it was a week ago.   Past Medical History  Diagnosis Date  . Hypertension     History reviewed. No pertinent past surgical history.  Prior to Admission medications   Medication Sig Start Date End Date Taking? Authorizing Provider  drospirenone-ethinyl estradiol (YASMIN,ZARAH,SYEDA) 3-0.03 MG tablet  Take 1 tablet by mouth daily.     Yes Historical Provider, MD  lisinopril (PRINIVIL,ZESTRIL) 20 MG tablet Take 20 mg by mouth daily.     Yes Historical Provider, MD  omeprazole (PRILOSEC OTC) 20 MG tablet Take 20 mg by mouth daily.     Yes Historical Provider, MD    Current Facility-Administered Medications  Medication Dose Route Frequency Provider Last Rate Last Dose  . 0.9 %  sodium chloride infusion   Intravenous Once Dione Booze, MD 125 mL/hr at 04/25/11 [phone]    . 0.9 %  sodium chloride infusion  250 mL Intravenous PRN Nishant Dhungel, MD      . 0.9 %  sodium chloride infusion   Intravenous Continuous Nishant Dhungel, MD 125 mL/hr at 04/25/11 1015    . ciprofloxacin (CIPRO) IVPB 400 mg  400 mg Intravenous Q12H Nishant Dhungel, MD   400 mg at 04/25/11 0928  . HYDROmorphone (DILAUDID) injection 1 mg  1 mg Intravenous Once Dione Booze, MD   1 mg at 04/25/11 0617  . iohexol (OMNIPAQUE) 300 MG/ML solution 100 mL  100 mL Intravenous Once PRN Dione Booze, MD   100 mL at 04/25/11 0744  . iohexol (OMNIPAQUE) 300 MG/ML solution 20 mL  20 mL Oral Q1 Hr x 2 Dione Booze, MD   20 mL at 04/25/11 0745  . lisinopril (PRINIVIL,ZESTRIL) tablet 20 mg  20 mg Oral Daily Nishant Dhungel, MD   20 mg at 04/25/11 1223  . metroNIDAZOLE (FLAGYL) IVPB 500 mg  500 mg Intravenous Q8H Meera K Patel, PHARMD   500 mg at 04/25/11 1346  .  omeprazole (PRILOSEC OTC) EC tablet 20 mg  20 mg Oral Daily Nishant Dhungel, MD   20 mg at 04/25/11 1100  . ondansetron (ZOFRAN) injection 4 mg  4 mg Intravenous Once Dione Booze, MD   4 mg at 04/25/11 1610  . ondansetron (ZOFRAN) injection 4 mg  4 mg Intravenous Once Dione Booze, MD   4 mg at 04/25/11 0824  . ondansetron (ZOFRAN) tablet 4 mg  4 mg Oral Q6H PRN Nishant Dhungel, MD       Or  . ondansetron (ZOFRAN) injection 4 mg  4 mg Intravenous Q6H PRN Nishant Dhungel, MD      . oxyCODONE (Oxy IR/ROXICODONE) immediate release tablet 5 mg  5 mg Oral Q4H PRN Nishant Dhungel, MD      . sodium  chloride 0.9 % bolus 1,000 mL  1,000 mL Intravenous Once Dione Booze, MD   1,000 mL at 04/25/11 0616  . sodium chloride 0.9 % injection 3 mL  3 mL Intravenous Q12H Nishant Dhungel, MD   3 mL at 04/25/11 1015  . sodium chloride 0.9 % injection 3 mL  3 mL Intravenous PRN Nishant Dhungel, MD      . DISCONTD: metroNIDAZOLE (FLAGYL) IVPB 500 mg  500 mg Intravenous Q8H Nishant Dhungel, MD      . DISCONTD: ondansetron (ZOFRAN) injection 4 mg  4 mg Intramuscular Once Dione Booze, MD        Allergies as of 04/25/2011  . (No Known Allergies)    No family history on file.  History   Social History  . Marital Status: Married    Spouse Name: N/A    Number of Children: N/A  . Years of Education: N/A   Occupational History  . Works in the [employer] office at [employer]   Social History Main Topics  . Smoking status: Never Smoker   . Smokeless tobacco: Not on file  . Alcohol Use: No  . Drug Use: No  . Sexually Active: Yes    Birth Control/ Protection: Pill   Other Topics Concern  . Not on file   Social History Narrative  . No narrative on file    Review of Systems: Positive as per history of present illness Gen:  Denies any fever, chills, rigors, night sweats, anorexia,  involuntary weight loss CV:  Denies chest pain, angina, palpitations, syncope Resp: Denies dyspnea, cough GI: Denies dysphagia, chronic abdominal pain, nausea, vomiting, vomiting blood, jaundice, black stools, rectal bleeding, constipation, chronic diarrhea    MS: Denies joint pain or swelling.  Denies muscle weakness, cramps, atrophy.  Psych: Denies depression, anxiety   Physical Exam: Vital signs in last 24 hours: Temp:  [97.8 F (36.6 C)-98.7 F (37.1 C)] 98.4 F (36.9 C) (12/23 1507) Pulse Rate:  [85-126] 100  (12/23 1507) Resp:  [18-20] 19  (12/23 1507) BP: (140-160)/(84-118) 144/84 mmHg (12/23 1507) SpO2:  [98 %-100 %] 100 % (12/23 1507) Last BM Date: 04/24/11 General:   Alert,   Well-developed, well-nourished, pleasant and cooperative in NAD Head:  Normocephalic and atraumatic. Eyes:  Sclera clear, no icterus.   Conjunctiva pink. Mouth:   No ulcerations or lesions.  Oropharynx pink & moist. Neck:   No masses or thyromegaly. Lungs:  Clear throughout to auscultation.   No wheezes, crackles, or rhonchi. No evident respiratory distress. Heart:   Regular rate and rhythm; no murmurs, clicks, rubs,  or gallops. Abdomen:  Soft, nontender, nontympanitic, and nondistended. No masses, hepatosplenomegaly or ventral hernias noted.  Normal bowel sounds, without bruits, guarding, or rebound.   Msk:   Symmetrical without gross deformities. Pulses:  Normal pulses noted. Extremities:   Without clubbing, cyanosis, or edema. Neurologic:  Alert and coherent;  grossly normal neurologically. Skin:  Intact without significant lesions or rashes. Cervical Nodes:  No significant cervical adenopathy. Psych:   Alert and cooperative. Normal mood and affect.  Intake/Output from previous day:   Intake/Output this shift:    Lab Results:  [facility] 04/25/11 0550  WBC 15.9*  HGB 14.4  HCT 42.3  PLT 332   BMET  Basename 04/25/11 0550  NA 134*  K 3.8  CL 101  CO2 22  GLUCOSE 146*  BUN 10  CREATININE 0.92  CALCIUM 9.5   LFT  Basename 04/25/11 0550  PROT 7.7  ALBUMIN 3.5  AST 12  ALT 19  ALKPHOS 57  BILITOT 0.4  BILIDIR --  IBILI --   PT/INR No results found for this basename: LABPROT:2,INR:2 in the last 72 hours   Studies/Results: Ct Abdomen Pelvis W Contrast  04/25/2011  *RADIOLOGY REPORT*  Clinical Data: Abdominal pain.  CT ABDOMEN AND PELVIS WITH CONTRAST  Technique:  Multidetector CT imaging of the abdomen and pelvis was performed following the standard protocol during bolus administration of intravenous contrast.  Contrast: 100 ml Omnipaque 300 IV.  Comparison: None.  Findings: Minimal dependent atelectasis in the lung bases.  Heart is normal size.  No effusions.   Markedly abnormal proximal and mid small bowel loops with wall thickening, significant mucosal enhancement, and mesenteric edema. Findings compatible with infectious or inflammatory enteritis. Distal small bowel is decompressed and unremarkable.  Colon is grossly unremarkable.  There is a small amount of perihepatic, perisplenic and pelvic free fluid.  Shotty retroperitoneal lymph nodes, none pathologically enlarged.  Uterus, adnexa, urinary bladder are unremarkable.  Appendix visualized and is normal. Liver, gallbladder, spleen, pancreas, adrenals and kidneys are normal.  Stomach is normal.  IMPRESSION: Significant wall thickening within the proximal and mid small bowel loops with mucosal enhancement, mesenteric edema and free fluid. Findings compatible with infectious or inflammatory enteritis.  These results were discussed with Dr. Preston Fleeting at the time of interpretation.  Original Report Authenticated By: Cyndie Chime, M.D.    Impression: Probable infectious enteritis. The abrupt onset of upper and lower tract symptoms in a normally healthy patient, without a prodrome of chronic GI tract symptomatology, is strongly suggestive of some sort of infectious illness. I tend to think it is probably viral in character. As mentioned above, the radius graphic findings, as well as the clinical history, do not suggest ischemia. For one thing, the patient is resting very comfortably without evident pain at this time.  Plan: I think antibiotic coverage is reasonable until we see the patient's white count coming down.  I would favor supportive care and symptomatic management, to include gradual dietary advancement as she progresses.   The fact that she has had resolution of her vomiting and diarrhea already is a good sign that perhaps this illness is already in the resolving phase.   If her symptoms persisted, or she had a recurrence of this sort of illness in the near future, consideration could be given to a small  bowel capsule endoscopy to try to visualize the mucosa of the affected area of the intestine, which is not readily accessible by endoscopy or colonoscopy.   The patient recognizes that the exact character of her underlying diagnosis is not clear, and therefore, both her optimal treatment  and her prognosis are similarly unclear.   LOS: 0 days   Facundo Allemand V  04/25/2011, 11:09 PM

## 2011-04-26 LAB — DIFFERENTIAL
Basophils Absolute: 0 10*3/uL (ref 0.0–0.1)
Basophils Relative: 0 % (ref 0–1)
Eosinophils Absolute: 0 10*3/uL (ref 0.0–0.7)
Eosinophils Relative: 0 % (ref 0–5)
Lymphocytes Relative: 23 % (ref 12–46)
Lymphs Abs: 2.4 10*3/uL (ref 0.7–4.0)
Monocytes Absolute: 0.7 10*3/uL (ref 0.1–1.0)
Monocytes Relative: 6 % (ref 3–12)
Neutro Abs: 7.4 10*3/uL (ref 1.7–7.7)
Neutrophils Relative %: 70 % (ref 43–77)

## 2011-04-26 LAB — CBC
HCT: 34.3 % — ABNORMAL LOW (ref 36.0–46.0)
Hemoglobin: 11.4 g/dL — ABNORMAL LOW (ref 12.0–15.0)
MCH: 29.9 pg (ref 26.0–34.0)
MCHC: 33.2 g/dL (ref 30.0–36.0)
MCV: 90 fL (ref 78.0–100.0)
Platelets: 285 10*3/uL (ref 150–400)
RBC: 3.81 MIL/uL — ABNORMAL LOW (ref 3.87–5.11)
RDW: 13.1 % (ref 11.5–15.5)
WBC: 10.5 10*3/uL (ref 4.0–10.5)

## 2011-04-26 LAB — BASIC METABOLIC PANEL
BUN: 6 mg/dL (ref 6–23)
CO2: 24 mEq/L (ref 19–32)
Calcium: 8.7 mg/dL (ref 8.4–10.5)
Chloride: 105 mEq/L (ref 96–112)
Creatinine, Ser: 0.97 mg/dL (ref 0.50–1.10)
GFR calc Af Amer: 85 mL/min — ABNORMAL LOW (ref >90)
GFR calc non Af Amer: 74 mL/min — ABNORMAL LOW (ref >90)
Glucose, Bld: 92 mg/dL (ref 70–99)
Potassium: 3.7 mEq/L (ref 3.5–5.1)
Sodium: 139 mEq/L (ref 135–145)

## 2011-04-26 MED ORDER — METRONIDAZOLE 500 MG PO TABS: 500.0000 mg | ORAL_TABLET | Freq: Three times a day (TID) | ORAL | Status: DC

## 2011-04-26 MED ORDER — CIPROFLOXACIN HCL 500 MG PO TABS: 500.0000 mg | ORAL_TABLET | Freq: Two times a day (BID) | ORAL | Status: AC

## 2011-04-26 MED ORDER — CIPROFLOXACIN HCL 500 MG PO TABS: 500.0000 mg | ORAL_TABLET | Freq: Two times a day (BID) | ORAL | Status: DC

## 2011-04-26 MED ORDER — METRONIDAZOLE 500 MG PO TABS: 500.0000 mg | ORAL_TABLET | Freq: Three times a day (TID) | ORAL | Status: AC

## 2011-04-26 NOTE — Progress Notes (Signed)
[facility] Progress Note  Subjective:  Patient feels better with no abdominal pain no stools and mild nausea. She is still on a clear liquid diet  Objective: Vital signs in last 24 hours: Temp:  [98.4 F (36.9 C)-99.3 F (37.4 C)] 99.3 F (37.4 C) (12/24 1610) Pulse Rate:  [90-100] 96  (12/24 0633) Resp:  [18-19] 18  (12/24 0633) BP: (93-144)/(61-84) 93/61 mmHg (12/24 0633) SpO2:  [93 %-100 %] 97 % (12/24 9604) Weight change:    PE: abdomen soft minimally tender in the lower abdomen   Lab Results: Results for orders placed during the hospital encounter of 04/25/11 (from the past 24 hour(s))  BASIC METABOLIC PANEL     Status: Abnormal   Collection Time   04/26/11  7:45 AM      Component Value Range   Sodium 139  135 - 145 (mEq/L)   Potassium 3.7  3.5 - 5.1 (mEq/L)   Chloride 105  96 - 112 (mEq/L)   CO2 24  19 - 32 (mEq/L)   Glucose, Bld 92  70 - 99 (mg/dL)   BUN 6  6 - 23 (mg/dL)   Creatinine, Ser 5.40  0.50 - 1.10 (mg/dL)   Calcium 8.7  8.4 - 98.1 (mg/dL)   GFR calc non Af Amer 74 (*) >90 (mL/min)   GFR calc Af Amer 85 (*) >90 (mL/min)  CBC     Status: Abnormal   Collection Time   04/26/11  7:45 AM      Component Value Range   WBC 10.5  4.0 - 10.5 (K/uL)   RBC 3.81 (*) 3.87 - 5.11 (MIL/uL)   Hemoglobin 11.4 (*) 12.0 - 15.0 (g/dL)   HCT 19.1 (*) 47.8 - 46.0 (%)   MCV 90.0  78.0 - 100.0 (fL)   MCH 29.9  26.0 - 34.0 (pg)   MCHC 33.2  30.0 - 36.0 (g/dL)   RDW 29.5  62.1 - 30.8 (%)   Platelets 285  150 - 400 (K/uL)  DIFFERENTIAL     Status: Normal   Collection Time   04/26/11  7:45 AM      Component Value Range   Neutrophils Relative 70  43 - 77 (%)   Neutro Abs 7.4  1.7 - 7.7 (K/uL)   Lymphocytes Relative 23  12 - 46 (%)   Lymphs Abs 2.4  0.7 - 4.0 (K/uL)   Monocytes Relative 6  3 - 12 (%)   Monocytes Absolute 0.7  0.1 - 1.0 (K/uL)   Eosinophils Relative 0  0 - 5 (%)   Eosinophils Absolute 0.0  0.0 - 0.7 (K/uL)   Basophils Relative 0  0 - 1 (%)   Basophils Absolute 0.0  0.0 - 0.1 (K/uL)    Studies/Results: Ct Abdomen Pelvis W Contrast  04/25/2011  *RADIOLOGY REPORT*  Clinical Data: Abdominal pain.  CT ABDOMEN AND PELVIS WITH CONTRAST  Technique:  Multidetector CT imaging of the abdomen and pelvis was performed following the standard protocol during bolus administration of intravenous contrast.  Contrast: 100 ml Omnipaque 300 IV.  Comparison: None.  Findings: Minimal dependent atelectasis in the lung bases.  Heart is normal size.  No effusions.  Markedly abnormal proximal and mid small bowel loops with wall thickening, significant mucosal enhancement, and mesenteric edema. Findings compatible with infectious or inflammatory enteritis. Distal small bowel is decompressed and unremarkable.  Colon is grossly unremarkable.  There is a small amount of perihepatic, perisplenic and pelvic free fluid.  Shotty retroperitoneal lymph nodes,  none pathologically enlarged.  Uterus, adnexa, urinary bladder are unremarkable.  Appendix visualized and is normal. Liver, gallbladder, spleen, pancreas, adrenals and kidneys are normal.  Stomach is normal.  IMPRESSION: Significant wall thickening within the proximal and mid small bowel loops with mucosal enhancement, mesenteric edema and free fluid. Findings compatible with infectious or inflammatory enteritis.  These results were discussed with Dr. Preston Fleeting at the time of interpretation.  Original Report Authenticated By: Cyndie Chime, M.D.      Assessment:  presumed infectious enteritis with proximal in mid jejunal thickening on CT scan  Plan:  attempting advancement of diet for lunch Continuing Cipro and Flagyl, change to oral when appropriate Wants to know if she can go home today since his Christmas Eve. I told her typically this would probably be a bit premature but if after several hours of observation after tolerating lunch, would not be unreasonable but with some increased risk of possible readmission. Will  arrange for outpatient followup with Dr. Matthias Hughs and will recheck tomorrow if still here.     Geselle Cardosa C 04/26/2011, 10:36 AM

## 2011-04-26 NOTE — Progress Notes (Signed)
Patient ID: Lynn Reeves MRN: [id number] DOB/AGE: 1976-01-23 35 y.o.  Admit date: 04/25/2011 Discharge date: 04/26/2011  Primary Care Physician:  Claude Manges, FNP, FNP  Discharge Diagnoses:    Present on Admission:  Small bowel Enteritis likely infectious .Hypertension .dehydration    Current Discharge Medication List    START taking these medications   Details  ciprofloxacin (CIPRO) 500 MG tablet Take 1 tablet (500 mg total) by mouth 2 (two) times daily. Qty: 12 tablet, Refills: 0    metroNIDAZOLE (FLAGYL) 500 MG tablet Take 1 tablet (500 mg total) by mouth 3 (three) times daily. Qty: 18 tablet, Refills: 0      CONTINUE these medications which have NOT CHANGED   Details  drospirenone-ethinyl estradiol (YASMIN,ZARAH,SYEDA) 3-0.03 MG tablet Take 1 tablet by mouth daily.      lisinopril (PRINIVIL,ZESTRIL) 20 MG tablet Take 20 mg by mouth daily.      omeprazole (PRILOSEC OTC) 20 MG tablet Take 20 mg by mouth daily.          Disposition and Follow-up:  Follow up with PCP in 1 week  follow up with Dr Matthias Hughs in 2 weeks  Consults:   Buccini ( GI)  Significant Diagnostic Studies:  Ct Abdomen Pelvis W Contrast  04/25/2011  *RADIOLOGY REPORT*  Clinical Data: Abdominal pain.  CT ABDOMEN AND PELVIS WITH CONTRAST  Technique:  Multidetector CT imaging of the abdomen and pelvis was performed following the standard protocol during bolus administration of intravenous contrast.  Contrast: 100 ml Omnipaque 300 IV.  Comparison: None.  Findings: Minimal dependent atelectasis in the lung bases.  Heart is normal size.  No effusions.  Markedly abnormal proximal and mid small bowel loops with wall thickening, significant mucosal enhancement, and mesenteric edema. Findings compatible with infectious or inflammatory enteritis. Distal small bowel is decompressed and unremarkable.  Colon is grossly unremarkable.  There is a small amount of perihepatic, perisplenic and pelvic free fluid.   Shotty retroperitoneal lymph nodes, none pathologically enlarged.  Uterus, adnexa, urinary bladder are unremarkable.  Appendix visualized and is normal. Liver, gallbladder, spleen, pancreas, adrenals and kidneys are normal.  Stomach is normal.  IMPRESSION: Significant wall thickening within the proximal and mid small bowel loops with mucosal enhancement, mesenteric edema and free fluid. Findings compatible with infectious or inflammatory enteritis.  These results were discussed with Dr. Preston Fleeting at the time of interpretation.  Original Report Authenticated By: Cyndie Chime, M.D.    Brief H and P: For complete details please refer to admission H and P, but in brief 35 year old female with a past medical history all for recently diagnosed hypertension who presented to the ED one week back with left mid quadrant abdominal pain of one day duration. She was given pain medications in the ED and was sent home. Was noted to have moderate leukocytosis at that time. She then went to an urgent care the next day and was informed to call PCP if symptoms persisted. He informs that her symptoms subsided however 2 days back she started having similar sharp abdominal pain. Since yesterday pain started getting worse and was having it several times during the day lasting for a few minutes and was radiating all across the abdomen. She also had 2 episodes of vomiting of food particles and 4 episodes of watery diarrhea without any blood. She denies any sick contacts, denies eating anything outside, denies being on antibiotics recently, denies recent travel, denies fever or chills, denies sick contacts , denies joint pains, no headache or  blurring of vision. He denies having any similar symptoms in the past. She denies any family history all for intermittent rebound disease. The ED she was noted to be tachycardic and hypertensive and as per the ED physician she was quite tender on palpation over the abdomen. CT scan of the abdomen and  pelvis in the ED showed significant wall thickening within the proximal and mid small bowel loops with mucosal enhancement, mesenteric edema and free fluid suggestive off infectious versus  inflammatory enteritis.   Physical Exam on Discharge:  Filed Vitals:   04/25/11 0945 04/25/11 1507 04/25/11 2341 04/26/11 0633  BP: 140/93 144/84 138/80 93/61  Pulse: 89 100 90 96  Temp: 97.8 F (36.6 C) 98.4 F (36.9 C) 98.6 F (37 C) 99.3 F (37.4 C)  TempSrc:  Oral Oral   Resp:  19 18 18   SpO2: 99% 100% 93% 97%     Intake/Output Summary (Last 24 hours) at 04/26/11 1329 Last data filed at 04/26/11 0700  Gross per 24 hour  Intake    360 ml  Output      0 ml  Net    360 ml    General:  Middle aged female in no acute distress. HEENT: No bruits, no goiter. Heart: Regular rate and rhythm, without murmurs, rubs, gallops. Lungs: Clear to auscultation bilaterally. Abdomen: Soft, minimal tenderness on deep palpation over  left mid quadrant,  nondistended, positive bowel sounds. Extremities: No clubbing cyanosis or edema with positive pedal pulses. Neuro: Alert, awake, oriented x3,Grossly intact, nonfocal.  CBC:    Component Value Date/Time   WBC 10.5 04/26/2011 0745   HGB 11.4* 04/26/2011 0745   HCT 34.3* 04/26/2011 0745   PLT 285 04/26/2011 0745   MCV 90.0 04/26/2011 0745   NEUTROABS 7.4 04/26/2011 0745   LYMPHSABS 2.4 04/26/2011 0745   MONOABS 0.7 04/26/2011 0745   EOSABS 0.0 04/26/2011 0745   BASOSABS 0.0 04/26/2011 0745    Basic Metabolic Panel:    Component Value Date/Time   NA 139 04/26/2011 0745   K 3.7 04/26/2011 0745   CL 105 04/26/2011 0745   CO2 24 04/26/2011 0745   BUN 6 04/26/2011 0745   CREATININE 0.97 04/26/2011 0745   GLUCOSE 92 04/26/2011 0745   CALCIUM 8.7 04/26/2011 0745    Hospital Course:    Acute small bowel enteritis likely infectious Patient admitted to medical floor  CT abdomen and pelvis on admission showing significant wall thickening within  the proximal and mid small bowel loops with mucosal enhancement, mesenteric edema and free fluid suggestive off infectious versus  inflammatory enteritis. Patient given IV hydration and started empirically on ciprofloxacin and flagyl  Unable to send stool for leucocytes, culture and cdiff as patient had no further diarrhea after admission Lactate and lipase negative  Discussed with Gi Dr Ronney Asters and recommended continuing current management She has clinically improved significantly and has tolerated advanced diet to regular. She can be discharged home on a total of 7 days of antibiotics  With po cipro and flagyl.  Hypertension  patient hypertensive on presentation likely due to pain  Continued on IV hydration and home BP med with improvement.   Patient clinically stable and can be discharged home with outpt follow with her  PCP and GI.     Time spent on Discharge: 45 minutes  Signed: Eddie North 04/26/2011, 1:29 PM

## 2011-04-28 NOTE — Progress Notes (Signed)
Retro ur ins review. 

## 2011-09-23 ENCOUNTER — Other Ambulatory Visit: Payer: Self-pay | Admitting: Family Medicine

## 2011-09-23 DIAGNOSIS — E010 Iodine-deficiency related diffuse (endemic) goiter: Secondary | ICD-10-CM

## 2011-09-30 ENCOUNTER — Ambulatory Visit
Admission: RE | Admit: 2011-09-30 | Discharge: 2011-09-30 | Disposition: A | Discharge status: Home / Self Care | Payer: BC Managed Care – PPO | Source: Ambulatory Visit | Attending: Family Medicine | Admitting: Family Medicine

## 2011-09-30 DIAGNOSIS — E010 Iodine-deficiency related diffuse (endemic) goiter: Secondary | ICD-10-CM

## 2012-01-29 ENCOUNTER — Ambulatory Visit (INDEPENDENT_AMBULATORY_CARE_PROVIDER_SITE_OTHER): Payer: BC Managed Care – PPO | Admitting: Family Medicine

## 2012-01-29 ENCOUNTER — Ambulatory Visit: Payer: BC Managed Care – PPO

## 2012-01-29 VITALS — BP 124/84 | HR 112 | Temp 98.7°F | Resp 17 | Ht 64.5 in | Wt 253.0 lb

## 2012-01-29 DIAGNOSIS — R10A0 Flank pain, unspecified side: Secondary | ICD-10-CM

## 2012-01-29 DIAGNOSIS — K5792 Diverticulitis of intestine, part unspecified, without perforation or abscess without bleeding: Secondary | ICD-10-CM | POA: Insufficient documentation

## 2012-01-29 DIAGNOSIS — R109 Unspecified abdominal pain: Secondary | ICD-10-CM

## 2012-01-29 DIAGNOSIS — N390 Urinary tract infection, site not specified: Secondary | ICD-10-CM

## 2012-01-29 DIAGNOSIS — K5900 Constipation, unspecified: Secondary | ICD-10-CM

## 2012-01-29 LAB — POCT UA - MICROSCOPIC ONLY
Casts, Ur, LPF, POC: NEGATIVE
Crystals, Ur, HPF, POC: NEGATIVE
Mucus, UA: NEGATIVE
Yeast, UA: NEGATIVE

## 2012-01-29 LAB — POCT URINALYSIS DIPSTICK
Bilirubin, UA: NEGATIVE
Glucose, UA: NEGATIVE
Ketones, UA: NEGATIVE
Leukocytes, UA: NEGATIVE
Nitrite, UA: NEGATIVE
Protein, UA: NEGATIVE
Spec Grav, UA: 1.015
Urobilinogen, UA: 0.2
pH, UA: 6

## 2012-01-29 LAB — COMPREHENSIVE METABOLIC PANEL
ALT: 12 U/L (ref 0–35)
AST: 15 U/L (ref 0–37)
Albumin: 4.3 g/dL (ref 3.5–5.2)
Alkaline Phosphatase: 53 U/L (ref 39–117)
BUN: 14 mg/dL (ref 6–23)
CO2: 29 mEq/L (ref 19–32)
Calcium: 9.1 mg/dL (ref 8.4–10.5)
Chloride: 102 mEq/L (ref 96–112)
Creat: 1.15 mg/dL — ABNORMAL HIGH (ref 0.50–1.10)
Glucose, Bld: 93 mg/dL (ref 70–99)
Potassium: 4 mEq/L (ref 3.5–5.3)
Sodium: 139 mEq/L (ref 135–145)
Total Bilirubin: 0.6 mg/dL (ref 0.3–1.2)
Total Protein: 7.3 g/dL (ref 6.0–8.3)

## 2012-01-29 LAB — POCT CBC
Granulocyte percent: 74.6 %G (ref 37–80)
HCT, POC: 44.2 % (ref 37.7–47.9)
Hemoglobin: 13.9 g/dL (ref 12.2–16.2)
Lymph, poc: 2.3 (ref 0.6–3.4)
MCH, POC: 29.8 pg (ref 27–31.2)
MCHC: 31.4 g/dL — AB (ref 31.8–35.4)
MCV: 94.8 fL (ref 80–97)
MID (cbc): 0.5 (ref 0–0.9)
MPV: 8.4 fL (ref 0–99.8)
POC Granulocyte: 8.3 — AB (ref 2–6.9)
POC LYMPH PERCENT: 21.1 %L (ref 10–50)
POC MID %: 4.3 %M (ref 0–12)
Platelet Count, POC: 382 10*3/uL (ref 142–424)
RBC: 4.66 M/uL (ref 4.04–5.48)
RDW, POC: 13.5 %
WBC: 11.1 10*3/uL — AB (ref 4.6–10.2)

## 2012-01-29 MED ORDER — CIPROFLOXACIN HCL 500 MG PO TABS: 500.0000 mg | ORAL_TABLET | Freq: Two times a day (BID) | ORAL | Status: DC

## 2012-01-29 NOTE — Progress Notes (Signed)
Subjective: For about a week the patient been having some pain in her right flank. Last night she felt discomfort all across the top mid abdomen. No nausea or vomiting. Bowels move normally yesterday. No complaints of constipation. She has had a hospitalization last year for a similar problem.  No dysuria.  No surgeries LMP 2 weeks ago  Objective. No cva tenderness.  Abd. Nl bs, soft, nontender.  Chest cta  Imp: Abdominal and flank pain ? Etiology  Plan  Labs, KUb  Results for orders placed in visit on 01/29/12  POCT CBC      Component Value Range   WBC 11.1 (*) 4.6 - 10.2 K/uL   Lymph, poc 2.3  0.6 - 3.4   POC LYMPH PERCENT 21.1  10 - 50 %L   MID (cbc) 0.5  0 - 0.9   POC MID % 4.3  0 - 12 %M   POC Granulocyte 8.3 (*) 2 - 6.9   Granulocyte percent 74.6  37 - 80 %G   RBC 4.66  4.04 - 5.48 M/uL   Hemoglobin 13.9  12.2 - 16.2 g/dL   HCT, POC 19.1  47.8 - 47.9 %   MCV 94.8  80 - 97 fL   MCH, POC 29.8  27 - 31.2 pg   MCHC 31.4 (*) 31.8 - 35.4 g/dL   RDW, POC 29.5     Platelet Count, POC 382  142 - 424 K/uL   MPV 8.4  0 - 99.8 fL  POCT UA - MICROSCOPIC ONLY      Component Value Range   WBC, Ur, HPF, POC 0-1     RBC, urine, microscopic 0-1     Bacteria, U Microscopic TRACE     Mucus, UA NEG     Epithelial cells, urine per micros 0-4     Crystals, Ur, HPF, POC NEG     Casts, Ur, LPF, POC NEG     Yeast, UA NEG    POCT URINALYSIS DIPSTICK      Component Value Range   Color, UA YELLOW     Clarity, UA CLEAR     Glucose, UA NEG     Bilirubin, UA NEG     Ketones, UA NEG     Spec Grav, UA 1.015     Blood, UA TRACE     pH, UA 6.0     Protein, UA NEG     Urobilinogen, UA 0.2     Nitrite, UA NEG     Leukocytes, UA Negative     [facility] reading (PRIMARY) by  Dr. Alwyn Ren nonspecific .  Due to slight elevation of wbc will cover for diverticulitis  Return if at all worse.

## 2012-01-29 NOTE — Patient Instructions (Addendum)
Take miralax daily, then taper off as discussed  Cipro twice daily  Return if worse

## 2012-01-31 ENCOUNTER — Encounter: Payer: Self-pay | Admitting: Family Medicine

## 2012-06-17 ENCOUNTER — Other Ambulatory Visit: Payer: Self-pay

## 2013-02-08 ENCOUNTER — Other Ambulatory Visit: Payer: Self-pay | Admitting: Family Medicine

## 2013-02-08 ENCOUNTER — Ambulatory Visit
Admission: RE | Admit: 2013-02-08 | Discharge: 2013-02-08 | Disposition: A | Discharge status: Home / Self Care | Payer: BC Managed Care – PPO | Source: Ambulatory Visit | Attending: Family Medicine | Admitting: Family Medicine

## 2013-02-08 DIAGNOSIS — M25551 Pain in right hip: Secondary | ICD-10-CM

## 2013-02-08 DIAGNOSIS — M25562 Pain in left knee: Secondary | ICD-10-CM

## 2013-02-08 DIAGNOSIS — M25561 Pain in right knee: Secondary | ICD-10-CM

## 2013-03-08 ENCOUNTER — Other Ambulatory Visit: Payer: Self-pay

## 2013-03-27 ENCOUNTER — Ambulatory Visit (INDEPENDENT_AMBULATORY_CARE_PROVIDER_SITE_OTHER): Payer: BC Managed Care – PPO | Admitting: Emergency Medicine

## 2013-03-27 VITALS — BP 132/78 | HR 107 | Temp 99.0°F | Resp 17 | Ht 64.5 in | Wt 252.0 lb

## 2013-03-27 DIAGNOSIS — M76899 Other specified enthesopathies of unspecified lower limb, excluding foot: Secondary | ICD-10-CM

## 2013-03-27 DIAGNOSIS — M25559 Pain in unspecified hip: Secondary | ICD-10-CM

## 2013-03-27 DIAGNOSIS — M7061 Trochanteric bursitis, right hip: Secondary | ICD-10-CM

## 2013-03-27 NOTE — Progress Notes (Addendum)
  Subjective:    Patient ID: Lynn Reeves, female    DOB: [birth year], 37 y.o.   MRN: [id number]  HPI 37 yo female with complaint of right hip pain.  Onset over last week.  Worse with ambulation.  Pain on outside of hip.  No new injury.  Pain with walking up and down stairs as well.  Has not taken any meds for this yet.  PPMH:  Mild right hip OA  SH: Non contributory  Review of Systems  Constitutional: Negative for fever and chills.  Respiratory: Negative for cough and shortness of breath.   Cardiovascular: Negative for chest pain.  Gastrointestinal: Negative for nausea, vomiting, diarrhea and constipation.  Genitourinary: Negative for dysuria.  Musculoskeletal: Positive for arthralgias and gait problem. Negative for back pain and myalgias.  Neurological: Negative for weakness and numbness.       Objective:   Physical Exam Blood pressure 132/78, pulse 107, temperature 99 F (37.2 C), temperature source Oral, resp. rate 17, height 5' 4.5" (1.638 m), weight 252 lb (114.306 kg), last menstrual period 03/13/2013, SpO2 98.00%. Body mass index is 42.6 kg/(m^2). Well-developed, well nourished female who is awake, alert and oriented, in NAD. HEENT: [place]/AT, PERRL, EOMI.  Sclera and conjunctiva are clear. Neck: supple, FROM Heart: RRR, no murmur Lungs: normal effort, CTA Abdomen: normo-active bowel sounds, supple, non-tender, no mass or organomegaly. Extremities: no cyanosis, clubbing or edema, TTP right greater trochanter. Skin: warm and dry without rash. Psychologic: good mood and appropriate affect, normal speech and behavior.      Assessment & Plan:  Trochanteric bursitis  Procedure:  Injection of right greater trochanter Consent obtained verbal. Time-out conducted. Noted no overlying erythema, induration, or other signs of local infection. Skin prepped in a sterile fashion. Topical analgesic spray: Ethyl chloride. Completed without difficulty. Meds: Kenalog 40 and 4 cc 1%  lidocaine. Pain immediately improved suggesting accurate placement of the medication. Advised to call if fevers/chills, erythema, induration, drainage, or persistent bleeding.    I have reviewed and agree with documentation. Robert P. Merla Riches, M.D.

## 2013-10-16 ENCOUNTER — Ambulatory Visit (INDEPENDENT_AMBULATORY_CARE_PROVIDER_SITE_OTHER): Payer: BC Managed Care – PPO | Admitting: Emergency Medicine

## 2013-10-16 ENCOUNTER — Ambulatory Visit (INDEPENDENT_AMBULATORY_CARE_PROVIDER_SITE_OTHER): Payer: BC Managed Care – PPO

## 2013-10-16 VITALS — BP 134/88 | HR 66 | Temp 97.8°F | Wt 253.4 lb

## 2013-10-16 DIAGNOSIS — M79609 Pain in unspecified limb: Secondary | ICD-10-CM

## 2013-10-16 DIAGNOSIS — M25541 Pain in joints of right hand: Secondary | ICD-10-CM

## 2013-10-16 MED ORDER — DICLOFENAC SODIUM 1 % TD GEL: 2.0000 g | Freq: Four times a day (QID) | TRANSDERMAL | Status: DC

## 2013-10-16 NOTE — Progress Notes (Signed)
   Subjective:    Patient ID: Lynn Reeves, female    DOB: 18-Feb-1976, 38 y.o.   MRN: [id number]  HPI Thumb pain , onset 4-5 days ago.  No injury.  Pain in IP joint.  No prior symptoms.  Denies known inciting event.  PPMH:  Hypertension  SH:  Nonsmoker, works on [employer].   Review of Systems As per HPI, otherwise all systems negative.    Objective:   Physical Exam BP 134/88  Pulse 66  Temp(Src) 97.8 F (36.6 C) (Oral)  Wt 253 lb 6.4 oz (114.941 kg)  SpO2 99% Well developed, well nourished female in NAD Left thumb :  Mild TTP IP joint.  No erythema.  Limited ROM secondary to pain.  Sensation intact.  Cap refill < 2 seconds.  Thumb Xray:  Negative.     Assessment & Plan:  Thumb pain/synovitis IP joint right thumb.  Rx for Voltaren gel.  Follow up if worsens.

## 2013-10-17 NOTE — Progress Notes (Signed)
I have read, reviewed and agree with note by Dr. McGrath. Jessica Copland, MD 

## 2014-05-20 ENCOUNTER — Other Ambulatory Visit: Payer: Self-pay | Admitting: Family Medicine

## 2014-05-20 DIAGNOSIS — Z1231 Encounter for screening mammogram for malignant neoplasm of breast: Secondary | ICD-10-CM

## 2014-05-30 ENCOUNTER — Ambulatory Visit
Admission: RE | Admit: 2014-05-30 | Discharge: 2014-05-30 | Disposition: A | Discharge status: Home / Self Care | Payer: BC Managed Care – PPO | Source: Ambulatory Visit | Attending: Family Medicine | Admitting: Family Medicine

## 2014-05-30 DIAGNOSIS — Z1231 Encounter for screening mammogram for malignant neoplasm of breast: Secondary | ICD-10-CM

## 2014-07-23 ENCOUNTER — Telehealth: Payer: Self-pay

## 2014-07-23 NOTE — Telephone Encounter (Signed)
Returned patient phone call Patient not available Left message on voice mail to return our call 

## 2014-07-23 NOTE — Telephone Encounter (Signed)
Patient is returning phone call from nurse, please f/u with pt. °

## 2014-11-08 ENCOUNTER — Ambulatory Visit (INDEPENDENT_AMBULATORY_CARE_PROVIDER_SITE_OTHER): Payer: BC Managed Care – PPO | Admitting: Podiatry

## 2014-11-08 ENCOUNTER — Ambulatory Visit (INDEPENDENT_AMBULATORY_CARE_PROVIDER_SITE_OTHER): Payer: BC Managed Care – PPO

## 2014-11-08 ENCOUNTER — Encounter: Payer: Self-pay | Admitting: Podiatry

## 2014-11-08 VITALS — BP 117/78 | HR 82 | Resp 12

## 2014-11-08 DIAGNOSIS — M722 Plantar fascial fibromatosis: Secondary | ICD-10-CM | POA: Diagnosis not present

## 2014-11-08 MED ORDER — DICLOFENAC SODIUM 75 MG PO TBEC: 75.0000 mg | DELAYED_RELEASE_TABLET | Freq: Two times a day (BID) | ORAL | Status: DC

## 2014-11-08 MED ORDER — TRIAMCINOLONE ACETONIDE 10 MG/ML IJ SUSP
10.0000 mg | Freq: Once | INTRAMUSCULAR | Status: AC
  Administered 2014-11-08: 10 mg

## 2014-11-08 NOTE — Progress Notes (Signed)
   Subjective:    Patient ID: Rosanne GuttingSandy Bell-Woods, female    DOB: December 14, 1975, 39 y.o.   MRN: [id number]  HPI  PT STATED RT FOOT HEEL IS WORSEN THAN THE LT FOOT FOR 4 MONTHS. FOOT IS GETTING WORSE ESPECIALLY WHEN GETTING UP IN THE MORNING. TRIED STRETCHING, AND SOAK WITH EPSOM SALT.  Review of Systems  Musculoskeletal: Positive for back pain.       Objective:   Physical Exam        Assessment & Plan:

## 2014-11-08 NOTE — Patient Instructions (Signed)
Plantar Fasciitis  Plantar fasciitis is a common condition that causes foot pain. It is soreness (inflammation) of the band of tough fibrous tissue on the bottom of the foot that runs from the heel bone (calcaneus) to the ball of the foot. The cause of this soreness may be from excessive standing, poor fitting shoes, running on hard surfaces, being overweight, having an abnormal walk, or overuse (this is common in runners) of the painful foot or feet. It is also common in aerobic exercise dancers and ballet dancers.  SYMPTOMS   Most people with plantar fasciitis complain of:   Severe pain in the morning on the bottom of their foot especially when taking the first steps out of bed. This pain recedes after a few minutes of walking.   Severe pain is experienced also during walking following a long period of inactivity.   Pain is worse when walking barefoot or up stairs  DIAGNOSIS    Your caregiver will diagnose this condition by examining and feeling your foot.   Special tests such as X-rays of your foot, are usually not needed.  PREVENTION    Consult a sports medicine professional before beginning a new exercise program.   Walking programs offer a good workout. With walking there is a lower chance of overuse injuries common to runners. There is less impact and less jarring of the joints.   Begin all new exercise programs slowly. If problems or pain develop, decrease the amount of time or distance until you are at a comfortable level.   Wear good shoes and replace them regularly.   Stretch your foot and the heel cords at the back of the ankle (Achilles tendon) both before and after exercise.   Run or exercise on even surfaces that are not hard. For example, asphalt is better than pavement.   Do not run barefoot on hard surfaces.   If using a treadmill, vary the incline.   Do not continue to workout if you have foot or joint problems. Seek professional help if they do not improve.  HOME CARE INSTRUCTIONS     Avoid activities that cause you pain until you recover.   Use ice or cold packs on the problem or painful areas after working out.   Only take over-the-counter or prescription medicines for pain, discomfort, or fever as directed by your caregiver.   Soft shoe inserts or athletic shoes with air or gel sole cushions may be helpful.   If problems continue or become more severe, consult a sports medicine caregiver or your own health care provider. Cortisone is a potent anti-inflammatory medication that may be injected into the painful area. You can discuss this treatment with your caregiver.  MAKE SURE YOU:    Understand these instructions.   Will watch your condition.   Will get help right away if you are not doing well or get worse.  Document Released: 01/12/2001 Document Revised: 07/12/2011 Document Reviewed: 03/13/2008  ExitCare Patient Information 2015 [employer]. This information is not intended to replace advice given to you by your health care provider. Make sure you discuss any questions you have with your health care provider.

## 2014-11-09 NOTE — Progress Notes (Signed)
Subjective:     Patient ID: Lynn Reeves, female   DOB: 03-25-1976, 39 y.o.   MRN: [id number]  HPI patient presents with approximate 4 month history of pain in the right over left arch stating that she does not remember specific injury but it's been getting worse especially when gets up in the morning or after periods of sitting   Review of Systems  All other systems reviewed and are negative.      Objective:   Physical Exam  Constitutional: She is oriented to person, place, and time.  Cardiovascular: Intact distal pulses.   Musculoskeletal: Normal range of motion.  Neurological: She is oriented to person, place, and time.  Skin: Skin is warm.  Nursing note and vitals reviewed.  neurovascular status intact with muscle strength adequate range of motion subtalar midtarsal joint within normal limits. Patient's noted to have quite a bit of discomfort in the right heel distal to the insertion into the calcaneus over the left with fluid buildup noted and pain when palpated with moderate depression of the arch noted. Patient has good digital perfusion and is well oriented 3     Assessment:     Plantar fasciitis right over left distal to the insertion to the calcaneus with structural changes in the arch and foot noted    Plan:     H&P and x-rays reviewed with patient of both feet. Injected the plantar fascia right 3 mg Kenalog 5 mg Xylocaine instructed on physical therapy and placed on mobile big 15 mg daily and dispensed fascial brace. The left will hopefully get better on its own and patient be reevaluated again in the next week and we'll probably need long-term orthotic type treatment

## 2014-11-15 ENCOUNTER — Encounter: Payer: Self-pay | Admitting: Podiatry

## 2014-11-15 ENCOUNTER — Ambulatory Visit (INDEPENDENT_AMBULATORY_CARE_PROVIDER_SITE_OTHER): Payer: BC Managed Care – PPO | Admitting: Podiatry

## 2014-11-15 VITALS — BP 114/73 | HR 86 | Resp 15

## 2014-11-15 DIAGNOSIS — M722 Plantar fascial fibromatosis: Secondary | ICD-10-CM

## 2014-11-15 NOTE — Progress Notes (Signed)
Subjective:     Patient ID: Lynn Reeves, female   DOB: February 13, 1976, 39 y.o.   MRN: [id number]  HPI patient states it's feeling some better but still having pain and the brace seems to be really helping me   Review of Systems     Objective:   Physical Exam Moderately obese female with moderate depression of the arch is found to have mid arch pain right with fluid buildup and discomfort still present upon palpation    Assessment:     Plantar fasciitis right improving but present with obesity and flatfoot as complicating factors    Plan:     Reviewed condition and at this time I scanned for a custom orthotic devices to reduce the pressure against the foot. Patient will be seen back to recheck when we get orthotics back or earlier if necessary

## 2014-12-06 ENCOUNTER — Ambulatory Visit: Payer: BC Managed Care – PPO | Admitting: *Deleted

## 2014-12-06 DIAGNOSIS — M722 Plantar fascial fibromatosis: Secondary | ICD-10-CM

## 2014-12-06 NOTE — Progress Notes (Signed)
Patient ID: Lynn Reeves, female   DOB: [birth year], 39 y.o.   MRN: [id number] Patient presents for orthotic pick up.  Verbal and written break in and wear instructions given.  Patient will follow up in 4 weeks if symptoms worsen or fail to improve.

## 2014-12-06 NOTE — Patient Instructions (Signed)

## 2015-05-27 ENCOUNTER — Telehealth: Payer: Self-pay | Admitting: *Deleted

## 2015-05-27 NOTE — Telephone Encounter (Signed)
Pt left name, DOB and phone number.  I spoke with pt, she asked if she could get a replacement for the plantar fascial brace.  I told her she could purchase another for $100.00, and she said she would try to find hers again.

## 2015-12-24 ENCOUNTER — Other Ambulatory Visit: Payer: Self-pay | Admitting: Family Medicine

## 2015-12-24 DIAGNOSIS — R51 Headache: Principal | ICD-10-CM

## 2015-12-24 DIAGNOSIS — R519 Headache, unspecified: Secondary | ICD-10-CM

## 2016-01-02 ENCOUNTER — Other Ambulatory Visit: Payer: BC Managed Care – PPO

## 2016-07-31 ENCOUNTER — Ambulatory Visit (INDEPENDENT_AMBULATORY_CARE_PROVIDER_SITE_OTHER): Payer: BC Managed Care – PPO

## 2016-07-31 ENCOUNTER — Ambulatory Visit (INDEPENDENT_AMBULATORY_CARE_PROVIDER_SITE_OTHER): Payer: BC Managed Care – PPO | Admitting: Physician Assistant

## 2016-07-31 VITALS — BP 125/77 | HR 84 | Temp 98.6°F | Resp 18 | Ht 64.0 in | Wt 248.5 lb

## 2016-07-31 DIAGNOSIS — G8929 Other chronic pain: Secondary | ICD-10-CM

## 2016-07-31 DIAGNOSIS — M7632 Iliotibial band syndrome, left leg: Secondary | ICD-10-CM | POA: Diagnosis not present

## 2016-07-31 DIAGNOSIS — M25562 Pain in left knee: Secondary | ICD-10-CM

## 2016-07-31 MED ORDER — NAPROXEN 500 MG PO TBEC: 500.0000 mg | DELAYED_RELEASE_TABLET | Freq: Two times a day (BID) | ORAL | 1 refills | Status: DC

## 2016-07-31 NOTE — Patient Instructions (Signed)
     IF you received an x-ray today, you will receive an invoice from [facility]. Please contact [facility] at [phone] with questions or concerns regarding your invoice.   IF you received labwork today, you will receive an invoice from [facility]. Please contact [facility] at [phone] with questions or concerns regarding your invoice.   Our billing staff will not be able to assist you with questions regarding bills from these companies.  You will be contacted with the lab results as soon as they are available. The fastest way to get your results is to activate your My Chart account. Instructions are located on the last page of this paperwork. If you have not heard from us regarding the results in 2 weeks, please contact this office.     

## 2016-07-31 NOTE — Progress Notes (Signed)
07/31/2016 4:38 PM   DOB: 1976/03/25 / MRN: [id number]  SUBJECTIVE:  Lynn Reeves is a 41 y.o. female presenting for left knee pain that she describes as mild and worse with movement.  The pain is medial right and the pain shoots up to distal 1/3 of femur.  She has tried Aleve, tylenol, ibuprofen, ice and heat.  The aleve helped more than the tylenol and ibuprofen. Heat was also helpful.  She denies swelling.  No history of gout. No trauma to the joint. She has a history of bilateral knee OA diagnosed by rheumatology however there is no radiographic evidence of this diagnosis. Getting up from the seated position makes the pain worse.   She has No Known Allergies.   She  has a past medical history of Arthritis; Asthma; and Hypertension.    She  reports that she has never smoked. She has never used smokeless tobacco. She reports that she does not drink alcohol or use drugs. She  reports that she currently engages in sexual activity. She reports using the following method of birth control/protection: Pill. The patient  has no past surgical history on file.  Her family history is not on file.  Review of Systems  Constitutional: Negative for chills and fever.  Musculoskeletal: Positive for joint pain. Negative for back pain and falls.  Neurological: Negative for dizziness.    The problem list and medications were reviewed and updated by myself where necessary and exist elsewhere in the encounter.   OBJECTIVE:  BP 125/77   Pulse 84   Temp 98.6 F (37 C) (Oral)   Resp 18   Ht  (1.626 m)   Wt 248 lb 8 oz (112.7 kg)   SpO2 100%   BMI 42.65 kg/m   Physical Exam  Cardiovascular: Normal rate.   Pulmonary/Chest: Effort normal.  Musculoskeletal: Normal range of motion. She exhibits tenderness (left IT band.  Left knee negative for joint line tenderness, deformity, swelling.  Ligamentous structures intact to challenge. ). She exhibits no edema.    Wt Readings from Last 3 Encounters:   07/31/16 248 lb 8 oz (112.7 kg)  10/16/13 253 lb 6.4 oz (114.9 kg)  03/27/13 252 lb (114.3 kg)    No results found for this or any previous visit (from the past 72 hour(s)).  Dg Knee Complete 4 Views Left  Result Date: 07/31/2016 CLINICAL DATA:  Left knee pain for 1.5 months. Suspected soft tissue injury. EXAM: LEFT KNEE - COMPLETE 4+ VIEW COMPARISON:  02/08/2013 FINDINGS: There is no evidence of fracture, dislocation, or knee joint effusion. Femorotibial joint space widths are preserved, with only minimal spurring noted in the medial compartment. Bone mineralization appears normal. No soft tissue abnormality is seen. IMPRESSION: No evidence of acute osseous abnormality or significant arthropathic change. Electronically Signed   By: Sebastian Ache M.D.   On: 07/31/2016 16:35      ASSESSMENT AND PLAN:  [name] was seen today for knee pain.  Diagnoses and all orders for this visit:  Chronic pain of left knee -     DG Knee Complete 4 Views Left; Future  It band syndrome, left: Rads showing some mild arthritis however her pain is out of proportion to that.  I am starting naprosyn and am sending her to PT for strengthening and stretching modalities. Will see her back if she does not improve and would consider MRI for further imaging.     The patient is advised to call or return to clinic  if she does not see an improvement in symptoms, or to seek the care of the closest emergency department if she worsens with the above plan.   Lynn Reeves, MHS, PA-C [facility] 07/31/2016 4:38 PM

## 2016-08-02 ENCOUNTER — Telehealth: Payer: Self-pay

## 2016-08-02 NOTE — Telephone Encounter (Signed)
Naproxen dr/ec on back order, please sub

## 2016-08-10 NOTE — Telephone Encounter (Signed)
PT CALLING AGAIN ABOUT ANOTHER RX SHE SAYS THAT THE NAPROXEN IS ON BACK ORDER AND REALLY NEED HER RX

## 2016-08-12 NOTE — Telephone Encounter (Signed)
Ibuprofen 800 mg prescribed.

## 2016-08-19 ENCOUNTER — Ambulatory Visit: Payer: BC Managed Care – PPO | Admitting: Physical Therapy

## 2016-08-30 ENCOUNTER — Ambulatory Visit: Payer: BC Managed Care – PPO | Admitting: Physical Therapy

## 2016-09-03 ENCOUNTER — Ambulatory Visit: Payer: BC Managed Care – PPO | Attending: Physician Assistant | Admitting: Physical Therapy

## 2016-09-03 ENCOUNTER — Encounter: Payer: Self-pay | Admitting: Physical Therapy

## 2016-09-03 DIAGNOSIS — G8929 Other chronic pain: Secondary | ICD-10-CM | POA: Insufficient documentation

## 2016-09-03 DIAGNOSIS — M25562 Pain in left knee: Secondary | ICD-10-CM | POA: Insufficient documentation

## 2016-09-03 DIAGNOSIS — M6281 Muscle weakness (generalized): Secondary | ICD-10-CM

## 2016-09-03 DIAGNOSIS — M25552 Pain in left hip: Secondary | ICD-10-CM | POA: Insufficient documentation

## 2016-09-03 NOTE — Therapy (Signed)
[facility] [address] [place], [place], [address] Phone: [phone]   Fax:  [phone]  Physical Therapy Evaluation  Patient Details  Name: Lynn Reeves MRN: [id number] Date of Birth: Sep 13, 1975 Referring Provider: Deliah Boston PA   Encounter Date: 09/03/2016      PT End of Session - 09/03/16 1214    Visit Number 1   Number of Visits 16   Date for PT Re-Evaluation 10/29/16   Authorization Type BCBS   PT Start Time 1100   PT Stop Time 1148   PT Time Calculation (min) 48 min   Activity Tolerance Patient tolerated treatment well   Behavior During Therapy [facility] for tasks assessed/performed      Past Medical History:  Diagnosis Date  . Arthritis   . Asthma   . Hypertension     History reviewed. No pertinent surgical history.  There were no vitals filed for this visit.       Subjective Assessment - 09/03/16 1106    Subjective Patient reports a history of knee pain that has lasted for about 6 years. She feels it when she sits for too long and when she is going down stairs. She is also having left hip pain but that pain is not as bad.    Limitations Walking;House hold activities   How long can you sit comfortably? No limit    How long can you stand comfortably? becomes stiff after sitting for extended period of time    How long can you walk comfortably? depends on the terrain    Diagnostic tests X-ray:    Currently in Pain? Yes   Pain Score --  has reached a 10/10 but no pain today    Pain Location Knee   Pain Orientation Left   Pain Descriptors / Indicators Aching   Pain Type Chronic pain   Pain Onset More than a month ago   Pain Frequency Intermittent   Aggravating Factors  standing, walking, squatting    Pain Relieving Factors rest    Effect of Pain on Daily Activities difficulty walking down stairs    Multiple Pain Sites Yes   Pain Score --  pain in the mlrnings can reach a 5/10 but no pain today    Pain Location Hip   Pain Orientation Left   Pain Descriptors / Indicators Aching   Pain Type Chronic pain   Pain Onset More than a month ago   Pain Frequency Constant   Aggravating Factors  standing, walking, stairs, mornning    Pain Relieving Factors rest    Effect of Pain on Daily Activities difficulty going up and down stairs             [facility] PT Assessment - 09/03/16 0001      Assessment   Medical Diagnosis Left leg pain    Referring Provider Deliah Boston PA    Onset Date/Surgical Date --  6 years prior    Hand Dominance Right   Next MD Visit Nothing scheduled    Prior Therapy None      Precautions   Precautions None     Restrictions   Weight Bearing Restrictions No     Balance Screen   Has the patient fallen in the past 6 months No   Has the patient had a decrease in activity level because of a fear of falling?  No   Is the patient reluctant to leave their home because of a fear of falling?  No  Home Environment   Additional Comments 1 step into the front door. 2 flights of strias into work      Prior Function   Level of Independence Independent   Vocation Full time employment   Games developer; computer work    Leisure walking      Cognition   Overall Cognitive Status Within Functional Limits for tasks assessed   Attention Focused   Focused Attention Appears intact   Memory Appears intact   Awareness Appears intact   Problem Solving Appears intact   Executive Function --     Sensation   Additional Comments Denies parathesias      Coordination   Gross Motor Movements are Fluid and Coordinated Yes   Fine Motor Movements are Fluid and Coordinated Yes     Posture/Postural Control   Posture Comments rounded shoulders bilateral valgus left greater then right.      ROM / Strength   AROM / PROM / Strength AROM;PROM;Strength     AROM   Overall AROM Comments pain and crpitus with end range knee flexion; limited hip ER;       Strength   Strength Assessment Site Hip;Knee   Right/Left Hip Right;Left   Left Hip Flexion 4/5   Left Hip ABduction 3+/5   Left Hip ADduction 5/5   Right/Left Knee Right;Left   Left Knee Flexion 5/5   Left Knee Extension 4+/5     Palpation   Palpation comment crpitus of the patella; tenderness to palpation in the greater trochanter.      Ambulation/Gait   Gait Comments bilateral valgus left greater then right; decreased right single leg stance time. on the left; decreased left hip flexion                    [facility] Adult PT Treatment/Exercise - 09/03/16 0001      Self-Care   Self-Care Other Self-Care Comments   Other Self-Care Comments  reivewed mconnel taping rationale and technique      Lumbar Exercises: Supine   AB Set Limitations abdominal breathing 2x10      Knee/Hip Exercises: Stretches   Passive Hamstring Stretch Limitations seated and 90/90 stretches    Other Knee/Hip Stretches prirformis strethcing to loosen lateral hip     Knee/Hip Exercises: Supine   Other Supine Knee/Hip Exercises supine clam with abdominal breathing                 PT Education - 09/03/16 1213    Education provided Yes   Education Details symptom mangement    Person(s) Educated Patient   Methods Explanation;Demonstration;Tactile cues;Verbal cues   Comprehension Verbalized understanding;Returned demonstration          PT Short Term Goals - 09/03/16 1250      PT SHORT TERM GOAL #1   Title Patient will demsotrate full pain free active hip range of motion    Time 4   Period Weeks   Status New     PT SHORT TERM GOAL #2   Title Patient will demsotrate independent application of mconnell tape if found to be beneficial   Time 4   Period Weeks   Status New     PT SHORT TERM GOAL #3   Title Patient will be indepdenent with inital HEP for strengthenig and stretching of the left lower extremity    Time 4   Period Weeks   Status New     PT SHORT TERM GOAL #4   Title  Patient will increase gross left lower extremity strength to 4+/5    Time 4   Period Weeks   Status New           PT Long Term Goals - 09/03/16 1318      PT LONG TERM GOAL #1   Title Patient will demonstrate 5/5 gross left lower extremity strength in order to stand for 1 hour    Time 8   Period Weeks   Status New     PT LONG TERM GOAL #2   Title Patient will walk 3000' without increased pain and with equal single leg stace time    Time 8   Period Weeks   Status New     PT LONG TERM GOAL #3   Title Patient will demsotrate a 32 % limitation on FOTO    Time 8   Period Weeks   Status New               Plan - 09/03/16 1223    Clinical Impression Statement Patient is a 41 year old female who presents with pain in her left knee and left hip. Her pain in her left knee is oworse then the hip. She has patellar crepitus with the patellar compression test. 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 She has tight hamstrings and tight musculature of her lateral hip. She was given stretches to address both., She also has some lateral hip weakness which may be contributing. She would benefit from skilled therapy to adress the above deficits. The patient was seen for a low complexity eval. Therapy alos reviewed M[place]ell taping.    Rehab Potential Good   PT Frequency 2x / week   PT Duration 8 weeks   PT Treatment/Interventions ADLs/Self Care Home Management;Cryotherapy;Electrical Stimulation;Iontophoresis 4mg /ml Dexamethasone;Moist Heat;Ultrasound;Gait training;Stair training;Patient/family education;Therapeutic activities;Therapeutic exercise;Manual techniques;Passive range of motion;Dry needling;Taping   PT Next Visit Plan add straight leg raise, consider SAQ or TKE; review stretches with the patient; COnsider patellar mobs; consdier IT band troll out for lateral tightness.    PT Home Exercise Plan piriformis stretch to stretch lateral  hip muscles; hamstring stretch; abdominal breathing, supine clamshell    Consulted and Agree with Plan of Care Patient      Patient will benefit from skilled therapeutic intervention in order to improve the following deficits and impairments:  Decreased range of motion, Difficulty walking, Decreased endurance, Decreased strength, Decreased activity tolerance, Pain, Increased muscle spasms, Increased fascial restricitons, Improper body mechanics, Postural dysfunction  Visit Diagnosis: Chronic pain of left knee - Plan: PT plan of care cert/re-cert  Pain in left hip - Plan: PT plan of care cert/re-cert  Muscle weakness (generalized) - Plan: PT plan of care cert/re-cert     Problem List Patient Active Problem List   Diagnosis Date Noted  . Diverticulitis 01/29/2012  . Enteritis 04/25/2011    Class: Acute  . Hypertension 04/25/2011  . Leucocytosis 04/25/2011    Dessie Comaavid J Marva Hendryx  PT DPT  09/03/2016, 1:48 PM  [facility] [address] [place], [place], [address] Phone: [phone]   Fax:  [phone]  Name: Lynn Reeves MRN: [id number] Date of Birth: [birth year]

## 2016-09-13 ENCOUNTER — Ambulatory Visit: Payer: BC Managed Care – PPO | Admitting: Physical Therapy

## 2016-09-13 ENCOUNTER — Encounter: Payer: Self-pay | Admitting: Physical Therapy

## 2016-09-13 DIAGNOSIS — M6281 Muscle weakness (generalized): Secondary | ICD-10-CM

## 2016-09-13 DIAGNOSIS — M25562 Pain in left knee: Principal | ICD-10-CM

## 2016-09-13 DIAGNOSIS — M25552 Pain in left hip: Secondary | ICD-10-CM

## 2016-09-13 DIAGNOSIS — G8929 Other chronic pain: Secondary | ICD-10-CM

## 2016-09-13 NOTE — Therapy (Signed)
[facility] Outpatient Rehabilitation [facility] [address] [place], [place], [address] Phone: [phone]   Fax:  [phone]  Physical Therapy Treatment  Patient Details  Name: Rorie Delmore MRN: [id number] Date of Birth: [birth year] Referring Provider: Deliah Boston PA   Encounter Date: 09/13/2016      PT End of Session - 09/13/16 1429    Visit Number 2   Number of Visits 16   Date for PT Re-Evaluation 10/29/16   Authorization Type BCBS   PT Start Time 0217   PT Stop Time 0300   PT Time Calculation (min) 43 min   Activity Tolerance Patient tolerated treatment well   Behavior During Therapy [facility] for tasks assessed/performed      Past Medical History:  Diagnosis Date  . Arthritis   . Asthma   . Hypertension     History reviewed. No pertinent surgical history.  There were no vitals filed for this visit.      Subjective Assessment - 09/13/16 1424    Subjective Patient reports no significant chnages in her knee. She has not had much pain in her hips. The tape seemed to have helped a little.    Limitations Walking;House hold activities   How long can you sit comfortably? No limit    How long can you stand comfortably? becomes stiff after sitting for extended period of time    How long can you walk comfortably? depends on the terrain    Diagnostic tests X-ray:    Currently in Pain? Yes   Pain Score 4    Pain Location Knee   Pain Orientation Left   Pain Descriptors / Indicators Aching   Pain Type Chronic pain   Pain Onset More than a month ago   Pain Frequency Intermittent   Aggravating Factors  Standing, walking, squatting    Pain Relieving Factors rest    Effect of Pain on Daily Activities difficulty going down stairs                          [facility] Adult PT Treatment/Exercise - 09/13/16 0001      Self-Care   Self-Care Other Self-Care Comments   Other Self-Care Comments  reivewed mconnel taping rationale and technique       Lumbar Exercises: Standing   Heel Raises Limitations 2x10    Other Standing Lumbar Exercises standing march 2x10      Lumbar Exercises: Supine   Bridge Limitations 2x10    Straight Leg Raises Limitations 2s10 with left      Knee/Hip Exercises: Stretches   Passive Hamstring Stretch Limitations with strpa 2x30sec hold    Other Knee/Hip Stretches prirformis strethcing to loosen lateral hip     Knee/Hip Exercises: Supine   Bridges Limitations 2x10    Straight Leg Raises Limitations 2x10    Other Supine Knee/Hip Exercises supine clam with abdominal breathing Green 2x10    Other Supine Knee/Hip Exercises ball squeeze with abdominal brace 2x10;                 PT Education - 09/13/16 1429    Education provided Yes   Education Details symptom mangement    Person(s) Educated Patient   Methods Explanation;Demonstration;Tactile cues;Verbal cues   Comprehension Verbalized understanding;Returned demonstration          PT Short Term Goals - 09/03/16 1250      PT SHORT TERM GOAL #1   Title Patient will demsotrate full pain free active  hip range of motion    Time 4   Period Weeks   Status New     PT SHORT TERM GOAL #2   Title Patient will demsotrate independent application of mconnell tape if found to be beneficial   Time 4   Period Weeks   Status New     PT SHORT TERM GOAL #3   Title Patient will be indepdenent with inital HEP for strengthenig and stretching of the left lower extremity    Time 4   Period Weeks   Status New     PT SHORT TERM GOAL #4   Title Patient will increase gross left lower extremity strength to 4+/5    Time 4   Period Weeks   Status New           PT Long Term Goals - 09/03/16 1318      PT LONG TERM GOAL #1   Title Patient will demonstrate 5/5 gross left lower extremity strength in order to stand for 1 hour    Time 8   Period Weeks   Status New     PT LONG TERM GOAL #2   Title Patient will walk 3000' without increased pain and  with equal single leg stace time    Time 8   Period Weeks   Status New     PT LONG TERM GOAL #3   Title Patient will demsotrate a 32 % limitation on FOTO    Time 8   Period Weeks   Status New               Plan - 09/13/16 1639    Clinical Impression Statement Reviewed taping technique with the patient. Therapy also reviewed HEP and added higher level exercises. She had no pain. She had crepitus with steps. She will not be seen until 5/30. She was encouraged to work on her exercises until that point.    Rehab Potential Good   PT Frequency 2x / week   PT Duration 8 weeks   PT Treatment/Interventions ADLs/Self Care Home Management;Cryotherapy;Electrical Stimulation;Iontophoresis 4mg /ml Dexamethasone;Moist Heat;Ultrasound;Gait training;Stair training;Patient/family education;Therapeutic activities;Therapeutic exercise;Manual techniques;Passive range of motion;Dry needling;Taping   PT Next Visit Plan add straight leg raise, consider SAQ or TKE; review stretches with the patient; COnsider patellar mobs; consdier IT band troll out for lateral tightness.    PT Home Exercise Plan piriformis stretch to stretch lateral hip muscles; hamstring stretch; abdominal breathing, supine clamshell    Consulted and Agree with Plan of Care Patient      Patient will benefit from skilled therapeutic intervention in order to improve the following deficits and impairments:  Decreased range of motion, Difficulty walking, Decreased endurance, Decreased strength, Decreased activity tolerance, Pain, Increased muscle spasms, Increased fascial restricitons, Improper body mechanics, Postural dysfunction  Visit Diagnosis: Chronic pain of left knee  Pain in left hip  Muscle weakness (generalized)     Problem List Patient Active Problem List   Diagnosis Date Noted  . Diverticulitis 01/29/2012  . Enteritis 04/25/2011    Class: Acute  . Hypertension 04/25/2011  . Leucocytosis 04/25/2011    Dessie Comaavid J  Shamonique Battiste  PT DPT  09/13/2016, 4:45 PM  [facility] [address] [place], [place], [address] Phone: [phone]   Fax:  [phone]  Name: Rosanne GuttingSandy Bell-Woods MRN: [id number] Date of Birth: June 14, 1975

## 2016-09-21 ENCOUNTER — Ambulatory Visit: Payer: BC Managed Care – PPO

## 2016-09-29 ENCOUNTER — Ambulatory Visit: Payer: BC Managed Care – PPO | Admitting: Physical Therapy

## 2016-10-05 ENCOUNTER — Encounter: Payer: Self-pay | Admitting: Physical Therapy

## 2016-10-05 ENCOUNTER — Ambulatory Visit: Payer: BC Managed Care – PPO | Attending: Physician Assistant | Admitting: Physical Therapy

## 2016-10-05 DIAGNOSIS — M25552 Pain in left hip: Secondary | ICD-10-CM

## 2016-10-05 DIAGNOSIS — M25562 Pain in left knee: Secondary | ICD-10-CM | POA: Insufficient documentation

## 2016-10-05 DIAGNOSIS — G8929 Other chronic pain: Secondary | ICD-10-CM | POA: Insufficient documentation

## 2016-10-05 DIAGNOSIS — M6281 Muscle weakness (generalized): Secondary | ICD-10-CM | POA: Diagnosis present

## 2016-10-06 NOTE — Therapy (Signed)
[place], [place], [address] Phone: [phone]   Fax:  [phone]  Physical Therapy Treatment/ Discharge   Patient Details  Name: Lynn Reeves MRN: [id number] Date of Birth: June 18, 1975 Referring Provider: Philis Fendt PA   Encounter Date: 10/05/2016      PT End of Session - 10/05/16 1643    Visit Number 3   Number of Visits 16   Date for PT Re-Evaluation 10/29/16   Authorization Type BCBS   PT Start Time 1636   PT Stop Time 1720   PT Time Calculation (min) 44 min   Activity Tolerance Patient tolerated treatment well   Behavior During Therapy [facility] for tasks assessed/performed      Past Medical History:  Diagnosis Date  . Arthritis   . Asthma   . Hypertension     History reviewed. No pertinent surgical history.  There were no vitals filed for this visit.      Subjective Assessment - 10/05/16 1640    Subjective Patient reports that all last week her left knee was sore and even her right knee became sore. This week she is having inproved pain in her left and no pain in her right. Her left knee pain is maybe a 3/10 today.    Limitations Walking;House hold activities   How long can you sit comfortably? No limit    How long can you stand comfortably? becomes stiff after sitting for extended period of time    How long can you walk comfortably? depends on the terrain    Diagnostic tests X-ray:    Currently in Pain? Yes   Pain Score 3    Pain Location Knee   Pain Orientation Left   Pain Descriptors / Indicators Aching   Pain Type Chronic pain   Pain Onset More than a month ago   Pain Frequency Intermittent   Aggravating Factors  standing, walking, squatting    Pain Relieving Factors rest    Effect of Pain on Daily Activities difficulty going down stairs                          [facility] Adult PT Treatment/Exercise - 10/06/16 0001      Self-Care   Self-Care Other  Self-Care Comments   Other Self-Care Comments  reivewed mconnel taping rationale and technique      Lumbar Exercises: Standing   Heel Raises Limitations 2x10    Other Standing Lumbar Exercises standing march 2x10      Lumbar Exercises: Supine   Bridge Limitations 2x10    Straight Leg Raises Limitations 2s10 with left      Knee/Hip Exercises: Stretches   Passive Hamstring Stretch Limitations with strpa 2x30sec hold    Other Knee/Hip Stretches prirformis strethcing to loosen lateral hip     Knee/Hip Exercises: Supine   Bridges Limitations 2x10    Straight Leg Raises Limitations 2x10    Other Supine Knee/Hip Exercises supine clam with abdominal breathing Green 2x10    Other Supine Knee/Hip Exercises ball squeeze with abdominal brace 2x10;                 PT Education - 10/05/16 1642    Education provided Yes   Education Details continue with symptom mangement    Person(s) Educated Patient   Methods Explanation;Demonstration;Tactile cues;Verbal cues   Comprehension Verbalized understanding;Returned demonstration          PT Short Term Goals -  10/06/16 1129      PT SHORT TERM GOAL #1   Title Patient will demsotrate full pain free active hip range of motion    Baseline No hip pain    Time 4   Period Weeks   Status Achieved     PT SHORT TERM GOAL #2   Title Patient will demsotrate independent application of mconnell tape if found to be beneficial   Baseline demsotrated mconnel taping    Time 4   Period Weeks   Status Achieved     PT SHORT TERM GOAL #3   Title Patient will be indepdenent with inital HEP for strengthenig and stretching of the left lower extremity    Baseline feels comfortable and confident with exercises    Time 4   Period Weeks   Status Achieved     PT SHORT TERM GOAL #4   Title Patient will increase gross left lower extremity strength to 4+/5    Baseline 4/5 hip flexion and abduction but will continue strengthening at home   Time 4    Period Weeks           PT Long Term Goals - 09/03/16 1318      PT LONG TERM GOAL #1   Title Patient will demonstrate 5/5 gross left lower extremity strength in order to stand for 1 hour    Time 8   Period Weeks   Status New     PT LONG TERM GOAL #2   Title Patient will walk 3000' without increased pain and with equal single leg stace time    Time 8   Period Weeks   Status New     PT LONG TERM GOAL #3   Title Patient will demsotrate a 32 % limitation on FOTO    Time 8   Period Weeks   Status New               Plan - 10/06/16 1126    Clinical Impression Statement Therapy added in light standing exercises to practice at home. She feels comfortable with her exercises at home. Therapy also reviewed taping with her again. She feels better with steps when she is taped. She is not pain free and does have some crpitus but she feels like she can progress herself at home. D.C to HEP at this time.     Clinical Presentation Stable   Clinical Decision Making Low   Rehab Potential Good   PT Frequency 2x / week   PT Duration 8 weeks   PT Treatment/Interventions ADLs/Self Care Home Management;Cryotherapy;Electrical Stimulation;Iontophoresis 30m/ml Dexamethasone;Moist Heat;Ultrasound;Gait training;Stair training;Patient/family education;Therapeutic activities;Therapeutic exercise;Manual techniques;Passive range of motion;Dry needling;Taping   PT Next Visit Plan D/c to HEP    PT Home Exercise Plan piriformis stretch to stretch lateral hip muscles; hamstring stretch; abdominal breathing, supine clamshell    Consulted and Agree with Plan of Care Patient      Patient will benefit from skilled therapeutic intervention in order to improve the following deficits and impairments:  Decreased range of motion, Difficulty walking, Decreased endurance, Decreased strength, Decreased activity tolerance, Pain, Increased muscle spasms, Increased fascial restricitons, Improper body mechanics, Postural  dysfunction  Visit Diagnosis: Chronic pain of left knee  Pain in left hip  Muscle weakness (generalized)  PHYSICAL THERAPY DISCHARGE SUMMARY  Visits from Start of Care: 3  Current functional level related to goals / functional outcomes: Has complkete HEP and feels like she is doing somewhat better on stairs   Remaining defict  Education / Equipment:HEP  Plan: Patient agrees to discharge.  Patient goals were not met. Patient is being discharged due to being pleased with the current functional level.  ?????       Problem List Patient Active Problem List   Diagnosis Date Noted  . Diverticulitis 01/29/2012  . Enteritis 04/25/2011    Class: Acute  . Hypertension 04/25/2011  . Leucocytosis 04/25/2011    Carney Living 10/06/2016, 11:34 AM  [facility] [address] [place], [place], [address] Phone: [phone]   Fax:  [phone]  Name: Lynn Reeves MRN: [id number] Date of Birth: 26-Nov-1975

## 2017-10-17 ENCOUNTER — Ambulatory Visit: Payer: BC Managed Care – PPO | Admitting: Physician Assistant

## 2018-06-07 IMAGING — DX DG KNEE COMPLETE 4+V*L*
4 series · 4 of 4 positions shown · non-contrast
Comparison: 02/08/2013

CLINICAL DATA: Left knee pain for 1.5 months. Suspected soft tissue
injury.

EXAM:
LEFT KNEE - COMPLETE 4+ VIEW

[knee ap]
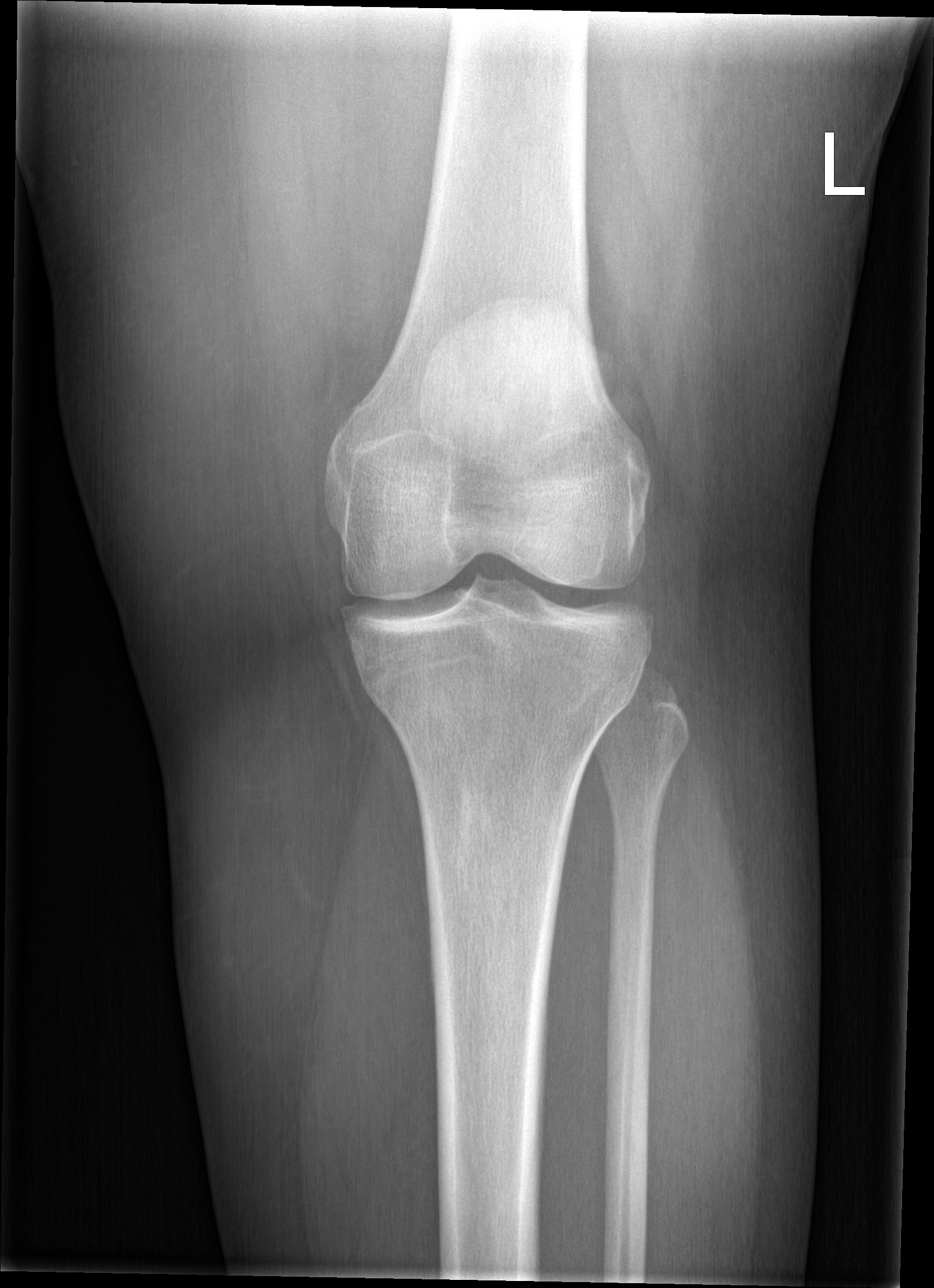

[knee obl (1 of 2)]
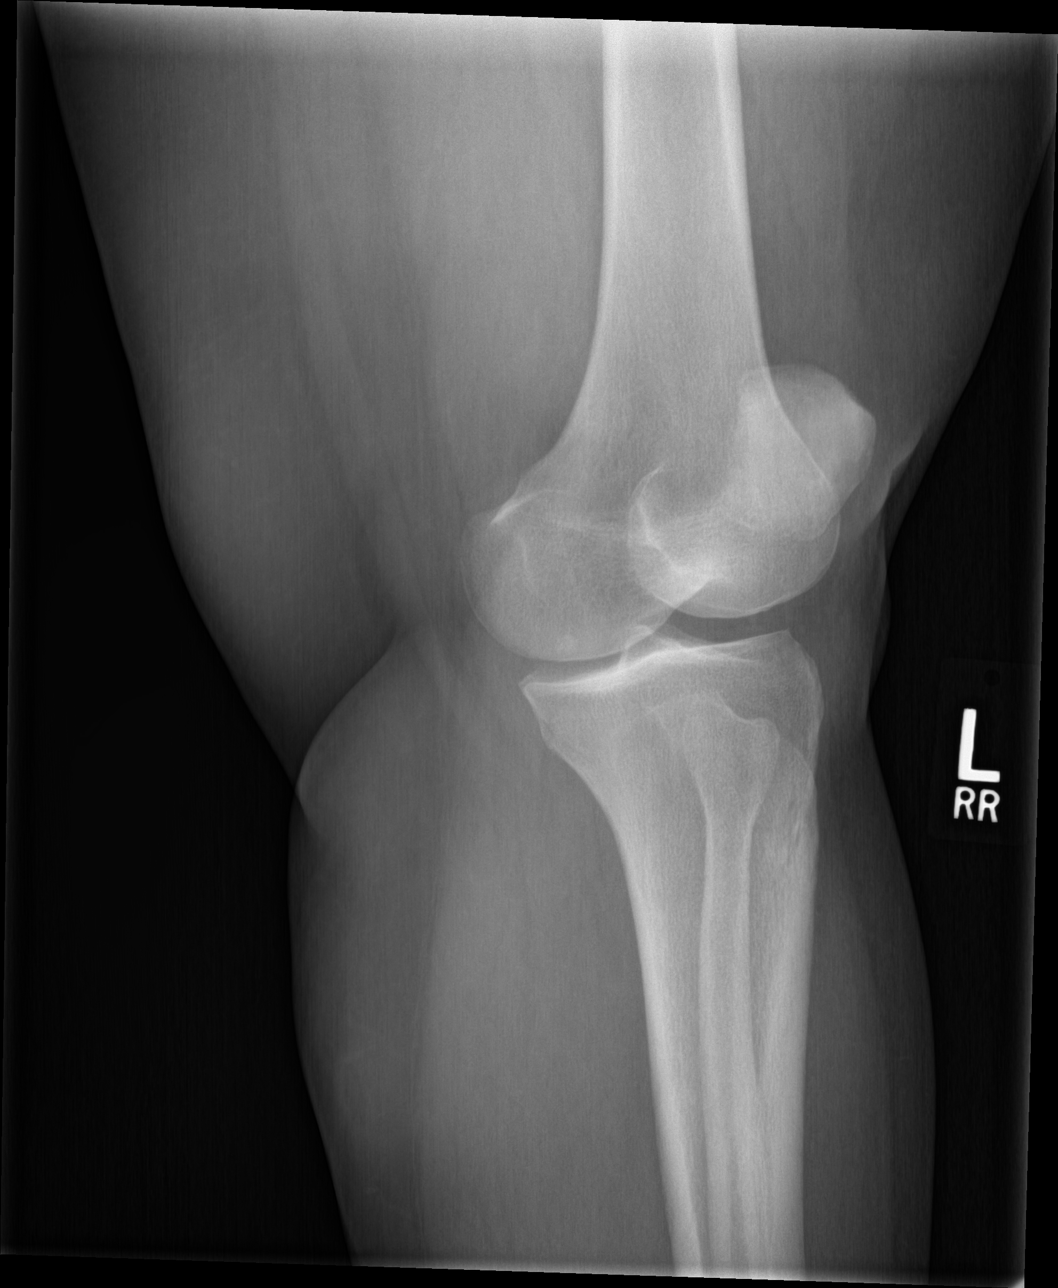

[knee obl (2 of 2)]
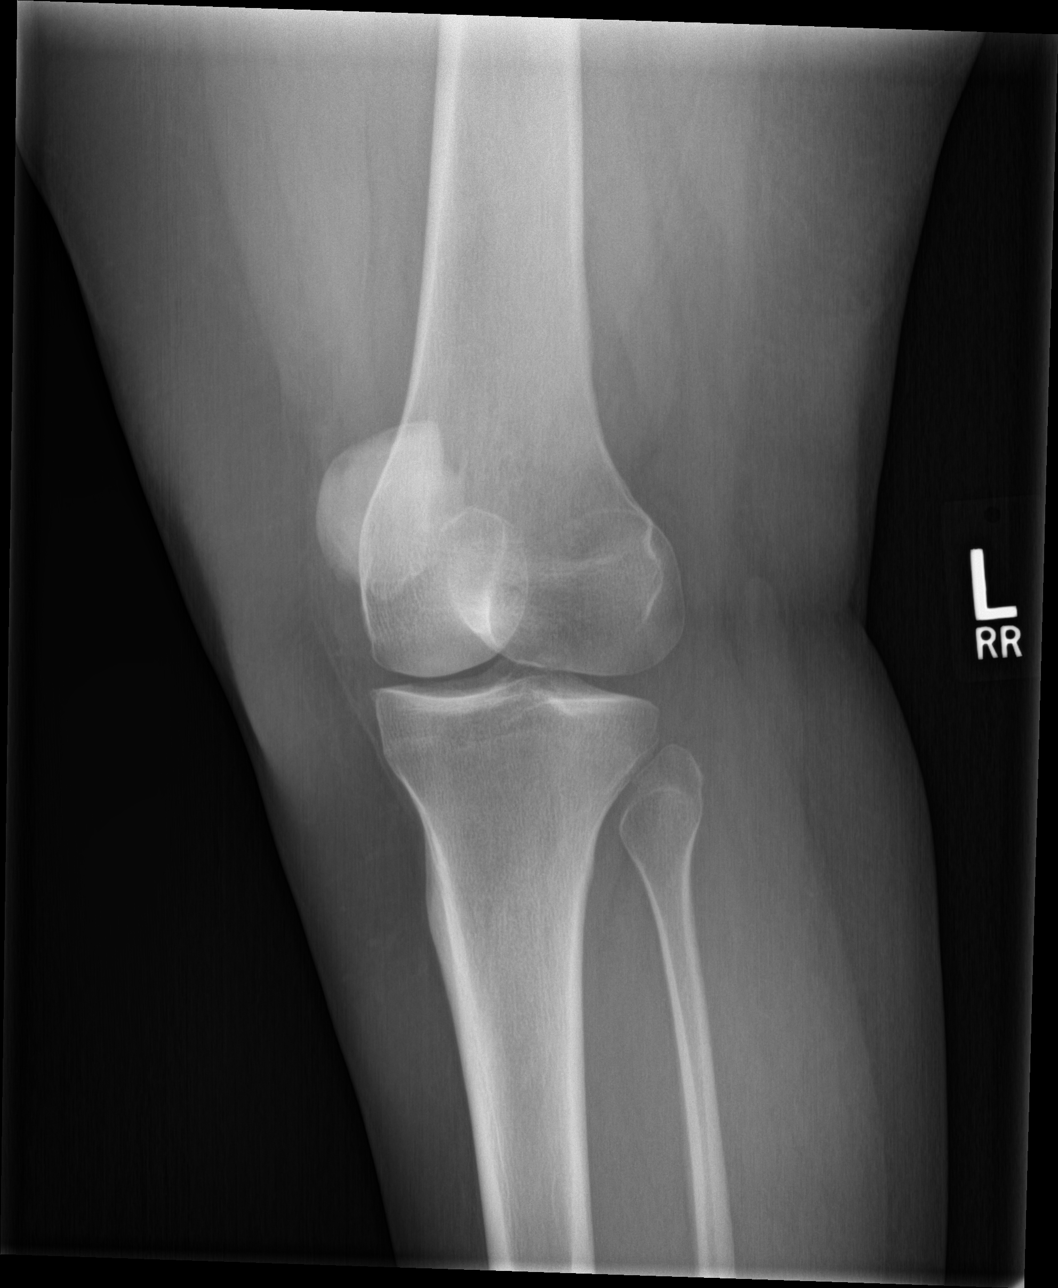

[knee lat]
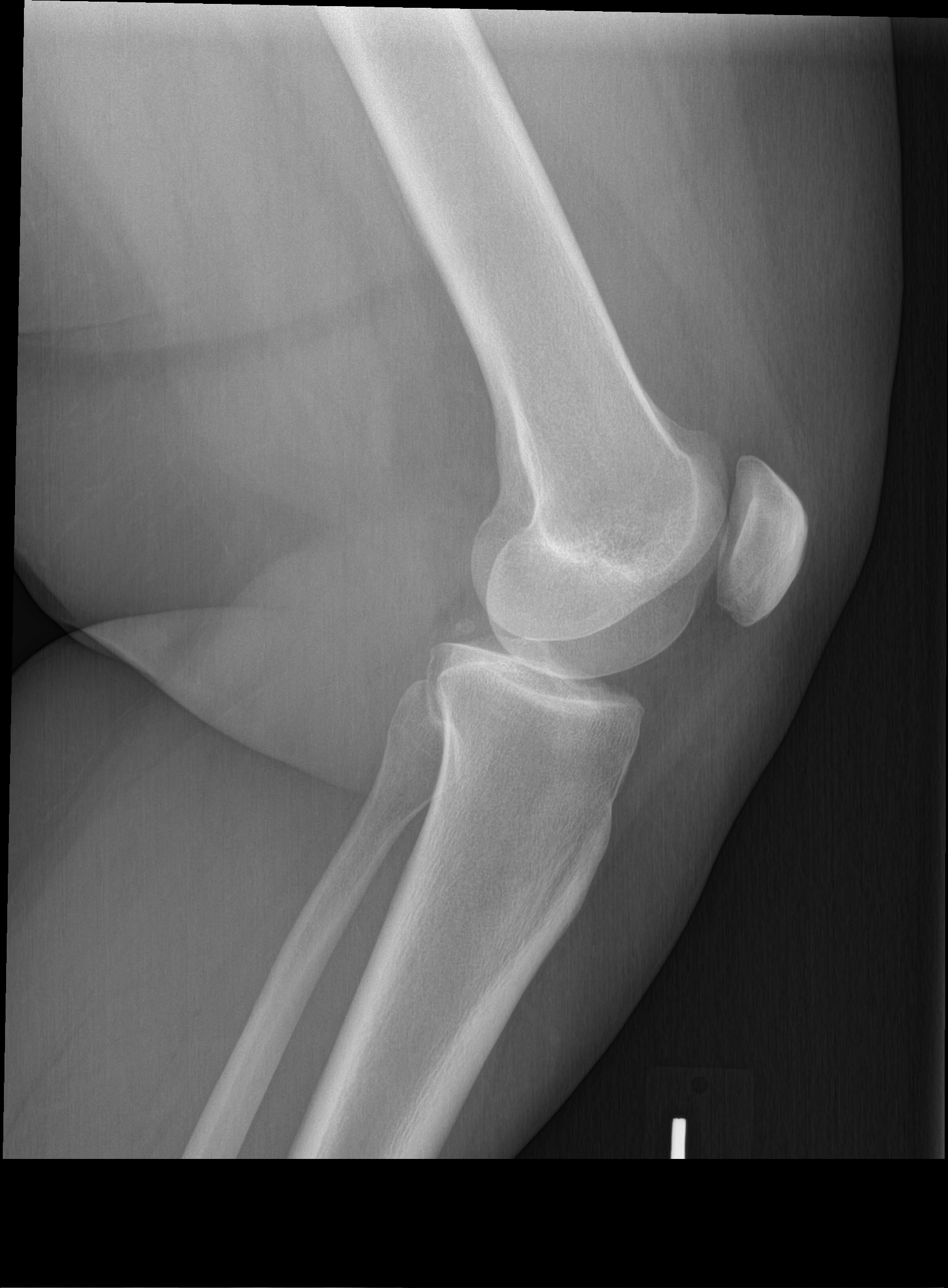

[4 of 4 positions shown; findings below may reference images not displayed]

FINDINGS: There is no evidence of fracture, dislocation, or knee joint
effusion. Femorotibial joint space widths are preserved, with only
minimal spurring noted in the medial compartment. Bone
mineralization appears normal. No soft tissue abnormality is seen.
IMPRESSION: No evidence of acute osseous abnormality or significant arthropathic
change.

## 2019-07-05 ENCOUNTER — Other Ambulatory Visit: Payer: Self-pay

## 2019-07-05 ENCOUNTER — Ambulatory Visit: Payer: BC Managed Care – PPO | Attending: Family

## 2019-07-05 DIAGNOSIS — Z23 Encounter for immunization: Secondary | ICD-10-CM | POA: Insufficient documentation

## 2019-07-05 NOTE — Progress Notes (Signed)
   Covid-19 Vaccination Clinic  Name:  Lynn Reeves    MRN: [id number] DOB: [birth year]  07/05/2019  Lynn Reeves was observed post Covid-19 immunization for 15 minutes without incident. She was provided with Vaccine Information Sheet and instruction to access the V-Safe system.   Lynn Reeves was instructed to call 911 with any severe reactions post vaccine: Marland Kitchen Difficulty breathing  . Swelling of face and throat  . A fast heartbeat  . A bad rash all over body  . Dizziness and weakness   Immunizations Administered    Name Date Dose VIS Date Route   Moderna COVID-19 Vaccine 07/05/2019  9:59 AM 0.5 mL 04/03/2019 Intramuscular   Manufacturer: Moderna   Lot: [id number]   NDC: 57505-183-35

## 2019-08-07 ENCOUNTER — Ambulatory Visit: Payer: BC Managed Care – PPO | Attending: Family

## 2019-08-07 DIAGNOSIS — Z23 Encounter for immunization: Secondary | ICD-10-CM

## 2019-08-07 NOTE — Progress Notes (Signed)
   Covid-19 Vaccination Clinic  Name:  Lynn Reeves    MRN: [id number] DOB: Sep 04, 1975  08/07/2019  [name] was observed post Covid-19 immunization for 15 minutes without incident. She was provided with Vaccine Information Sheet and instruction to access the V-Safe system.   [name] was instructed to call 911 with any severe reactions post vaccine: Marland Kitchen Difficulty breathing  . Swelling of face and throat  . A fast heartbeat  . A bad rash all over body  . Dizziness and weakness   Immunizations Administered    Name Date Dose VIS Date Route   Moderna COVID-19 Vaccine 08/07/2019 10:10 AM 0.5 mL 04/03/2019 Intramuscular   Manufacturer: Moderna   Lot: [id number]   NDC: 68115-726-20

## 2020-04-23 ENCOUNTER — Ambulatory Visit: Payer: BC Managed Care – PPO | Attending: Family

## 2020-04-23 DIAGNOSIS — Z23 Encounter for immunization: Secondary | ICD-10-CM

## 2020-04-24 ENCOUNTER — Other Ambulatory Visit: Payer: Self-pay | Admitting: Family Medicine

## 2020-04-24 ENCOUNTER — Ambulatory Visit
Admission: RE | Admit: 2020-04-24 | Discharge: 2020-04-24 | Disposition: A | Discharge status: Home / Self Care | Payer: BC Managed Care – PPO | Source: Ambulatory Visit | Attending: Family Medicine | Admitting: Family Medicine

## 2020-04-24 DIAGNOSIS — M25562 Pain in left knee: Secondary | ICD-10-CM

## 2020-04-24 DIAGNOSIS — G8929 Other chronic pain: Secondary | ICD-10-CM

## 2020-04-24 DIAGNOSIS — M25551 Pain in right hip: Secondary | ICD-10-CM

## 2020-08-27 NOTE — Progress Notes (Signed)
   Covid-19 Vaccination Clinic  Name:  Lynn Reeves    MRN: [id number] DOB: 07/19/1975  08/27/2020  [name] was observed post Covid-19 immunization for 15 minutes without incident. She was provided with Vaccine Information Sheet and instruction to access the V-Safe system.   [name] was instructed to call 911 with any severe reactions post vaccine: Marland Kitchen Difficulty breathing  . Swelling of face and throat  . A fast heartbeat  . A bad rash all over body  . Dizziness and weakness   Immunizations Administered    Name Date Dose VIS Date Route   Moderna Covid-19 Booster Vaccine 04/23/2020 10:00 AM 0.25 mL 02/20/2020 Intramuscular   Manufacturer: Moderna   Lot: [id number]   NDC: 26948-546-27

## 2021-02-20 DIAGNOSIS — Z6841 Body Mass Index (BMI) 40.0 and over, adult: Secondary | ICD-10-CM | POA: Diagnosis not present

## 2021-02-20 DIAGNOSIS — Z01419 Encounter for gynecological examination (general) (routine) without abnormal findings: Secondary | ICD-10-CM | POA: Diagnosis not present

## 2021-02-20 DIAGNOSIS — Z1231 Encounter for screening mammogram for malignant neoplasm of breast: Secondary | ICD-10-CM | POA: Diagnosis not present

## 2021-02-25 DIAGNOSIS — Z13220 Encounter for screening for lipoid disorders: Secondary | ICD-10-CM | POA: Diagnosis not present

## 2021-02-25 DIAGNOSIS — I10 Essential (primary) hypertension: Secondary | ICD-10-CM | POA: Diagnosis not present

## 2021-02-25 DIAGNOSIS — Z0000 Encounter for general adult medical examination without abnormal findings: Secondary | ICD-10-CM | POA: Diagnosis not present

## 2021-02-25 DIAGNOSIS — Z23 Encounter for immunization: Secondary | ICD-10-CM | POA: Diagnosis not present

## 2021-04-16 ENCOUNTER — Encounter: Payer: Self-pay | Admitting: Podiatry

## 2021-04-16 ENCOUNTER — Other Ambulatory Visit: Payer: Self-pay

## 2021-04-16 ENCOUNTER — Ambulatory Visit (INDEPENDENT_AMBULATORY_CARE_PROVIDER_SITE_OTHER): Payer: BC Managed Care – PPO

## 2021-04-16 ENCOUNTER — Ambulatory Visit: Payer: BC Managed Care – PPO | Admitting: Podiatry

## 2021-04-16 DIAGNOSIS — F419 Anxiety disorder, unspecified: Secondary | ICD-10-CM

## 2021-04-16 DIAGNOSIS — K219 Gastro-esophageal reflux disease without esophagitis: Secondary | ICD-10-CM | POA: Insufficient documentation

## 2021-04-16 DIAGNOSIS — M179 Osteoarthritis of knee, unspecified: Secondary | ICD-10-CM | POA: Insufficient documentation

## 2021-04-16 DIAGNOSIS — M7661 Achilles tendinitis, right leg: Secondary | ICD-10-CM

## 2021-04-16 HISTORY — DX: Anxiety disorder, unspecified: F41.9

## 2021-04-16 HISTORY — DX: Gastro-esophageal reflux disease without esophagitis: K21.9

## 2021-04-16 MED ORDER — DEXAMETHASONE SODIUM PHOSPHATE 120 MG/30ML IJ SOLN
2.0000 mg | Freq: Once | INTRAMUSCULAR | Status: AC
  Administered 2021-04-16: 2 mg via INTRA_ARTICULAR

## 2021-04-16 MED ORDER — METHYLPREDNISOLONE 4 MG PO TBPK: ORAL_TABLET | ORAL | 0 refills | Status: DC

## 2021-04-16 NOTE — Progress Notes (Signed)
°  Subjective:  Patient ID: Lynn Reeves, female    DOB: 03-28-1976,  MRN: [id number] HPI Chief Complaint  Patient presents with   Foot Pain    Posterior heel/achilles right - aching x 6 months, pulling and tightness in achilles, Urgent Care Rx'd meloxicam-some help, Ortho-recommended stretches-no help   New Patient (Initial Visit)    45 y.o. female presents with the above complaint.   ROS: Denies fever chills nausea vomiting muscle aches pains calf pain back pain chest pain shortness of breath.  Past Medical History:  Diagnosis Date   Anxiety 04/16/2021   Arthritis    Asthma    Gastroesophageal reflux disease without esophagitis 04/16/2021   Hypertension    No past surgical history on file.  Current Outpatient Medications:    methylPREDNISolone (MEDROL DOSEPAK) 4 MG TBPK tablet, 6 day dose pack - take as directed, Disp: 21 tablet, Rfl: 0   lansoprazole (PREVACID) 30 MG capsule, Take 30 mg by mouth daily as needed., Disp: , Rfl:    lisinopril-hydrochlorothiazide (ZESTORETIC) 20-12.5 MG tablet, Take 1 tablet by mouth daily., Disp: , Rfl:    LO LOESTRIN FE 1 MG-10 MCG / 10 MCG tablet, 1 (ONE) TABLET, ORAL, DAILY, Disp: , Rfl: 12   meloxicam (MOBIC) 15 MG tablet, Take 15 mg by mouth daily., Disp: , Rfl:   Allergies  Allergen Reactions   Sulfamethoxazole-Trimethoprim     Other reaction(s): hives   Review of Systems Objective:  There were no vitals filed for this visit.  General: Well developed, nourished, in no acute distress, alert and oriented x3   Dermatological: Skin is warm, dry and supple bilateral. Nails x 10 are well maintained; remaining integument appears unremarkable at this time. There are no open sores, no preulcerative lesions, no rash or signs of infection present.  Vascular: Dorsalis Pedis artery and Posterior Tibial artery pedal pulses are 2/4 bilateral with immedate capillary fill time. Pedal hair growth present. No varicosities and no lower extremity edema  present bilateral.   Neruologic: Grossly intact via light touch bilateral. Vibratory intact via tuning fork bilateral. Protective threshold with Semmes Wienstein monofilament intact to all pedal sites bilateral. Patellar and Achilles deep tendon reflexes 2+ bilateral. No Babinski or clonus noted bilateral.   Musculoskeletal: No gross boney pedal deformities bilateral. No pain, crepitus, or limitation noted with foot and ankle range of motion bilateral. Muscular strength 5/5 in all groups tested bilateral.  Pain on palpation of the Achilles tendon with gastroc equinus right.  Pain on palpation of the posterior superior aspect of the calcaneus  Gait: Unassisted, Nonantalgic.    Radiographs:  Radiographs taken today demonstrate osseously mature individual normal-appearing Achilles may be slightly thickened at its insertion on the posterior superior aspect of the calcaneus.  There is a small posterior heel spur is present no signs of infection.  No signs of fracture.  Assessment & Plan:   Assessment: Achilles tendinitis right  Plan: Discussed etiology pathology conservative surgical therapies I injected the area today subcutaneously with just 2 mg of dexamethasone local anesthetic making sure not to inject into the tendon itself.  She tolerated procedure well after Betadine skin prep.  Start her on methylprednisolone to be followed by meloxicam.  Also placed her in a cam walker follow-up with her in 1 month we also discussed stretching exercises and ice therapy.    Lynn Reeves T. [place], [place]

## 2021-05-01 DIAGNOSIS — U071 COVID-19: Secondary | ICD-10-CM | POA: Diagnosis not present

## 2021-05-01 DIAGNOSIS — Z20822 Contact with and (suspected) exposure to covid-19: Secondary | ICD-10-CM | POA: Diagnosis not present

## 2021-05-19 ENCOUNTER — Other Ambulatory Visit: Payer: Self-pay

## 2021-05-19 ENCOUNTER — Ambulatory Visit: Payer: BC Managed Care – PPO | Admitting: Podiatry

## 2021-05-19 ENCOUNTER — Encounter: Payer: Self-pay | Admitting: Podiatry

## 2021-05-19 DIAGNOSIS — M7661 Achilles tendinitis, right leg: Secondary | ICD-10-CM | POA: Diagnosis not present

## 2021-05-19 MED ORDER — METHYLPREDNISOLONE 4 MG PO TBPK: ORAL_TABLET | ORAL | 0 refills | Status: DC

## 2021-05-19 MED ORDER — DEXAMETHASONE SODIUM PHOSPHATE 120 MG/30ML IJ SOLN
2.0000 mg | Freq: Once | INTRAMUSCULAR | Status: AC
  Administered 2021-05-19: 2 mg via INTRA_ARTICULAR

## 2021-05-19 NOTE — Progress Notes (Signed)
She presents today states that she is at least 50% improved that she was much better immediately after her injection wearing the boot and her medication last visit.  Objective: Vital signs are stable she is alert and oriented x3.  She still has some tenderness on the central posterior aspect of her right heel no longer warm on palpation there is no edema no erythema cellulitis drainage or odor.  No tenderness on palpation of the Achilles itself.  Assessment: Most likely insertional Achilles tendinitis or bursitis.  Plan: I injected the area again today subcutaneously 2 mg of dexamethasone local anesthetic also started her on a Medrol Dosepak once again and we will follow-up with her in 1 more month.

## 2021-06-18 ENCOUNTER — Ambulatory Visit: Payer: BC Managed Care – PPO | Admitting: Podiatry

## 2021-07-14 ENCOUNTER — Ambulatory Visit: Payer: BC Managed Care – PPO | Admitting: Podiatry

## 2021-07-14 ENCOUNTER — Encounter: Payer: Self-pay | Admitting: Podiatry

## 2021-07-14 ENCOUNTER — Other Ambulatory Visit: Payer: Self-pay

## 2021-07-14 DIAGNOSIS — S86011A Strain of right Achilles tendon, initial encounter: Secondary | ICD-10-CM | POA: Diagnosis not present

## 2021-07-14 DIAGNOSIS — M7661 Achilles tendinitis, right leg: Secondary | ICD-10-CM | POA: Diagnosis not present

## 2021-07-14 NOTE — Progress Notes (Signed)
Lynn Reeves presents today for follow-up of her insertional Achilles tendinitis of her right heel.  She states that really is the same she took the Medrol Dosepak she has tried the cam walker she is done everything that we have asked her to do to no avail.  She states that is still only about 50% improved from her initial evaluation. ? ?Objective: Vital signs are stable alert and oriented x3.  Pulses are palpable.  She still has sharp piercing pain on palpation of the posterior aspect of the Achilles at its insertion site medially and laterally as well as centrally.  She has no proximal pain on palpation of the tendon itself. ? ?Assessment: Most likely an insertional intrasubstance tear of the Achilles at its insertion however the only way to know this is MRI. ? ?Plan: Requesting MRI of the right Achilles tendinous insertion highly suspect a tear secondary to failure of conservative therapy to alleviate her symptomatology. ? ?She will notify us if she does not hear from the scheduling center. ?

## 2021-07-21 DIAGNOSIS — M542 Cervicalgia: Secondary | ICD-10-CM | POA: Diagnosis not present

## 2021-07-21 DIAGNOSIS — M25512 Pain in left shoulder: Secondary | ICD-10-CM | POA: Diagnosis not present

## 2021-07-31 ENCOUNTER — Ambulatory Visit
Admission: RE | Admit: 2021-07-31 | Discharge: 2021-07-31 | Disposition: A | Discharge status: Home / Self Care | Payer: BC Managed Care – PPO | Source: Ambulatory Visit | Attending: Podiatry | Admitting: Podiatry

## 2021-07-31 DIAGNOSIS — M25571 Pain in right ankle and joints of right foot: Secondary | ICD-10-CM | POA: Diagnosis not present

## 2021-07-31 DIAGNOSIS — S86011A Strain of right Achilles tendon, initial encounter: Secondary | ICD-10-CM

## 2021-08-04 ENCOUNTER — Telehealth: Payer: Self-pay | Admitting: *Deleted

## 2021-08-04 DIAGNOSIS — M7750 Other enthesopathy of unspecified foot: Secondary | ICD-10-CM

## 2021-08-04 DIAGNOSIS — M7661 Achilles tendinitis, right leg: Secondary | ICD-10-CM

## 2021-08-04 NOTE — Telephone Encounter (Signed)
-----   Message from Garrel Ridgel, [place] sent at 08/04/2021  7:36 AM EDT ----- ?She really needs to try PT and an arthritic profile to be certain. ?

## 2021-08-17 NOTE — Addendum Note (Signed)
Addended by: Lottie Rater E on: 08/17/2021 03:55 PM ? ? Modules accepted: Orders ? ?

## 2021-08-19 DIAGNOSIS — M7750 Other enthesopathy of unspecified foot: Secondary | ICD-10-CM | POA: Diagnosis not present

## 2021-08-22 LAB — ANTI-NUCLEAR AB-TITER (ANA TITER): ANA Titer 1: 1:160 {titer} — ABNORMAL HIGH

## 2021-08-22 LAB — RHEUMATOID FACTOR: Rheumatoid fact SerPl-aCnc: <14 IU/mL (ref <14)

## 2021-08-22 LAB — URIC ACID: Uric Acid, Serum: 6 mg/dL (ref 2.5–7.0)

## 2021-08-22 LAB — C-REACTIVE PROTEIN: CRP: 19.8 mg/L — ABNORMAL HIGH (ref <8.0)

## 2021-08-22 LAB — ANA: Anti Nuclear Antibody (ANA): POSITIVE — AB

## 2021-08-22 LAB — SEDIMENTATION RATE: Sed Rate: 14 mm/h (ref 0–20)

## 2021-08-27 ENCOUNTER — Telehealth: Payer: Self-pay | Admitting: *Deleted

## 2021-08-27 DIAGNOSIS — R768 Other specified abnormal immunological findings in serum: Secondary | ICD-10-CM

## 2021-08-27 NOTE — Telephone Encounter (Signed)
-----   Message from Elinor Parkinson, [place] sent at 08/24/2021  5:39 PM EDT ----- ?She needs to see Rheumatology  moderately high ANA ?

## 2021-08-27 NOTE — Telephone Encounter (Signed)
Please advise 

## 2021-08-28 DIAGNOSIS — I10 Essential (primary) hypertension: Secondary | ICD-10-CM | POA: Diagnosis not present

## 2021-08-28 DIAGNOSIS — R768 Other specified abnormal immunological findings in serum: Secondary | ICD-10-CM | POA: Diagnosis not present

## 2021-08-28 DIAGNOSIS — K219 Gastro-esophageal reflux disease without esophagitis: Secondary | ICD-10-CM | POA: Diagnosis not present

## 2021-10-10 ENCOUNTER — Ambulatory Visit: Payer: BC Managed Care – PPO | Attending: Podiatry | Admitting: Rehabilitative and Restorative Service Providers"

## 2021-10-10 ENCOUNTER — Encounter: Payer: Self-pay | Admitting: Rehabilitative and Restorative Service Providers"

## 2021-10-10 DIAGNOSIS — R2689 Other abnormalities of gait and mobility: Secondary | ICD-10-CM | POA: Diagnosis not present

## 2021-10-10 DIAGNOSIS — R278 Other lack of coordination: Secondary | ICD-10-CM | POA: Insufficient documentation

## 2021-10-10 DIAGNOSIS — M7661 Achilles tendinitis, right leg: Secondary | ICD-10-CM | POA: Diagnosis not present

## 2021-10-10 NOTE — Therapy (Signed)
[facility] [address] [place], [place], [address] Phone: [phone]   Fax:  [phone]  Patient Details  Name: Lynn GuttingSandy Reeves MRN: [id number] Date of Birth: 1975/05/20 Referring Provider:  Elinor ParkinsonHyatt, Max T, [place]  Encounter Date: 10/10/2021   OUTPATIENT PHYSICAL THERAPY LOWER EXTREMITY EVALUATION   Patient Name: Lynn GuttingSandy Reeves MRN: [id number] DOB:1975/05/20, 46 y.o., female Today's Date: 10/10/2021   PT End of Session - 10/10/21 1226     Visit Number 1    Number of Visits 12    Date for PT Re-Evaluation 11/21/21    Authorization Type BCBS    Progress Note Due on Visit 10    PT Start Time 0900    PT Stop Time 0948    PT Time Calculation (min) 48 min    Activity Tolerance Patient tolerated treatment well;No increased pain    Behavior [facility] for tasks assessed/performed             Past Medical History:  Diagnosis Date   Anxiety 04/16/2021   Arthritis    Asthma    Gastroesophageal reflux disease without esophagitis 04/16/2021   Hypertension    History reviewed. No pertinent surgical history. Patient Active Problem List   Diagnosis Date Noted   Anxiety 04/16/2021   Gastroesophageal reflux disease without esophagitis 04/16/2021   Morbid obesity (HCC) 04/16/2021   Osteoarthritis of knee 04/16/2021   Diverticulitis 01/29/2012   Enteritis 04/25/2011    Class: Acute   Hypertension 04/25/2011   Leucocytosis 04/25/2011    PCP: Deatra JamesVyvyan Sun, MD  REFERRING PROVIDER: Ernestene KielHyatt, Max, DPM  REFERRING DIAG: [phone] (ICD-10-CM) - Achilles tendinitis of right lower extremity  THERAPY DIAG:  Other abnormalities of gait and mobility  Other lack of coordination  Rationale for Evaluation and Treatment Rehabilitation  ONSET DATE: 1 year ago  SUBJECTIVE:   SUBJECTIVE STATEMENT: It was a pain that never went away; very uncomfortable to walk. Always around the heel. Went to the drs; had cortisone shot and pain  went away for a month but at the followup the pain was back so got another cortisone injection and it was good for 3 weeks. When she was walking   PERTINENT HISTORY:see subjective   PAIN:  Are you having pain? Yes: NPRS scale: 5/10 Pain location: R achilles Pain description: more stiff in the morning, soreness and tenderness if press on it.  Aggravating factors: walking Relieving factors: nothing  PRECAUTIONS: None  WEIGHT BEARING RESTRICTIONS No  FALLS:  Has patient fallen in last 6 months? No  LIVING ENVIRONMENT: Lives with: lives with their family Lives in: House/apartment    PLOF: Independent  PATIENT GOALS to walk without pain   OBJECTIVE:   DIAGNOSTIC FINDINGS:  08/01/21 MRI: 1. No acute injury of the Achilles tendon. Mild enthesopathic changes of the Achilles tendon insertion. 2.  No acute osseous injury of the right ankle. Past treatments: Medrol Dosepak, Cam walker, Meloxicam, HEP  PATIENT SURVEYS:  FOTO 59% function  COGNITION:  Overall cognitive status: Within functional limits for tasks assessed     SENSATION: WFL  EDEMA: none   POSTURE:  overweight; limited arm swing  PALPATION: Hypermobile metatarsals R; toe flexion/ext WNL and = bil; calf/achilles not tender to palpation, plantar fascia tighter R but not tender; PROM R ankle = compared to L WNL  LOWER EXTREMITY MMT:  Active ROM Right eval Left eval  Hip flexion 5/5 5/5  Hip extension 3+/5 3+/5  Hip abduction    Hip adduction  Hip internal rotation    Hip external rotation    Knee flexion 5/5 5/5  Knee extension 5/5 5/5  Ankle dorsiflexion 5/5 5/5  Ankle plantarflexion 5/5 5/5  Ankle inversion 5/5 5/5  Ankle eversion 5/5 5/5   (Blank rows = not tested) Pt also able to do standing calf/toe raises without difficulty  LOWER EXTREMITY ROM: all measured supine   L calcaneal PROM inversion 10 and eversion 11; R inversion 18 and eversion 10 (measured in degrees)  Resting calcaneal  position L 2 degrees and R 6 degrees Knee AROM WNL AND FULL  BALANCE: Pt unable to SLS without UE A with noticeable ankle instability   GAIT: Distance walked: IN GYM Assistive device utilized: None Level of assistance: Complete Independence Comments: coxa vara, genu valga, flat footed bil in sneakers with ankle instability noted; without shoes, L calcaneal eversion    TODAY'S TREATMENT: 10/10/21: see patient education section   PATIENT EDUCATION:  Education details: discussed PT POC, answered questions regarding anatomy, advised pt to wear supportive shoes at all times for now; issued HEP Person educated: Patient Education method: Explanation, Demonstration, and Handouts Education comprehension: verbalized understanding   HOME EXERCISE PROGRAM: Access Code: [email or web address] URL: [email or web address] Date: 10/10/2021 Prepared by: Luna Fuse  Exercises - Arch Lifting  - 2 x daily - 7 x weekly - 1 sets - 10 reps - Seated Anterior Tibialis Stretch  - 2 x daily - 7 x weekly - 1 sets - 2-3 reps - Single Leg Stance  - 2 x daily - 7 x weekly - 1 sets - 2 reps - 30 sec hold - Towel Scrunches  - 2 x daily - 7 x weekly - 1 sets - 10 reps - Seated Plantar Fascia Stretch  - 1 x daily - 7 x weekly - 1 sets - 2 reps - 30 sec hold  ASSESSMENT:  CLINICAL IMPRESSION: Patient is a 46 y.o. female who was seen today for physical therapy evaluation and treatment for R distal achilles pain. However, pt presents with hypermobility throughout metatarsals right and tightness R plantar fascia. Her resting calcaneal position measured prone at 6 degrees and she presents with increased R inversion PROM.  Pt would benefit from PT for R metatarsal strengthening, balance TE and stretching of R foot invertors/plantar fascia R. Pt is concerned about insurance/copay.   OBJECTIVE IMPAIRMENTS Abnormal gait, decreased balance, decreased coordination, decreased endurance, difficulty walking, and  hypermobility .   ACTIVITY LIMITATIONS locomotion level  PARTICIPATION LIMITATIONS:  walking  PERSONAL FACTORS 1 comorbidity: weight  are also affecting patient's functional outcome.   REHAB POTENTIAL: Good  CLINICAL DECISION MAKING: Stable/uncomplicated  EVALUATION COMPLEXITY: Low   GOALS: Goals reviewed with patient? Yes   LONG TERM GOALS: Target date:  11/21/21    Pt will be indep with HEP to assist with pain management   Baseline: given at eval Goal status: INITIAL  2.  Pt will be able to walk 1 hour with </=3/10 pain Baseline: 8/10 Goal status: INITIAL  3.  Pt will be able to SLS >20 R LE without UE assist to assist with improved gait with reduced pain Baseline: unable Goal status: INITIAL  4.  Pt will demo improved foto score to 72% function Baseline: 59% function Goal status: INITIAL  5.  Pt will have improved resting calcaneal position to </=3 degrees to assist with more normalized gait Baseline: 6 degrees Goal status: INITIAL     PLAN: PT FREQUENCY: 1-2x/week  PT DURATION: 6 weeks  PLANNED INTERVENTIONS: Therapeutic exercises, Therapeutic activity, Neuromuscular re-education, Balance training, Gait training, Patient/Family education, Joint mobilization, Dry Needling, Cryotherapy, Moist heat, Taping, Ultrasound, Ionotophoresis 4mg /ml Dexamethasone, Manual therapy, and Re-evaluation  PLAN FOR NEXT SESSION: review HEP; progress metatarsal strengthening, stretch invertors all to R LE   , PT 10/10/2021, 12:28 PM    12/10/2021, PT 10/10/2021, 12:28 PM  [facility] [address] [place], [place], [place] Phone: [phone]   Fax:  [phone]

## 2021-10-14 NOTE — Therapy (Signed)
OUTPATIENT PHYSICAL THERAPY TREATMENT NOTE   Patient Name: Lynn Reeves MRN: [id number] DOB:[birth year], 46 y.o., female Today's Date: 10/16/2021  PCP: Deatra James, MD REFERRING PROVIDER: Ernestene Kiel, DPM  END OF SESSION:   PT End of Session - 10/16/21 1348     Visit Number 2    Number of Visits 12    Date for PT Re-Evaluation 11/21/21    Authorization Type BCBS    Progress Note Due on Visit 10    PT Start Time 1345    PT Stop Time 1425    PT Time Calculation (min) 40 min    Activity Tolerance Patient tolerated treatment well;No increased pain    Behavior During Therapy [facility] for tasks assessed/performed             Past Medical History:  Diagnosis Date   Anxiety 04/16/2021   Arthritis    Asthma    Gastroesophageal reflux disease without esophagitis 04/16/2021   Hypertension    History reviewed. No pertinent surgical history. Patient Active Problem List   Diagnosis Date Noted   Anxiety 04/16/2021   Gastroesophageal reflux disease without esophagitis 04/16/2021   Morbid obesity (HCC) 04/16/2021   Osteoarthritis of knee 04/16/2021   Diverticulitis 01/29/2012   Enteritis 04/25/2011    Class: Acute   Hypertension 04/25/2011   Leucocytosis 04/25/2011    REFERRING DIAG: M76.61 (ICD-10-CM) - Achilles tendinitis of right lower extremity  THERAPY DIAG: Other abnormalities of gait and mobility   Other lack of coordination   Rationale for Evaluation and Treatment Rehabilitation  PERTINENT HISTORY: Encounter Date:  07/14/2021   Signed                 Lynn Reeves presents today for follow-up of her insertional Achilles tendinitis of her right heel.  She states that really is the same she took the Medrol Dosepak she has tried the cam walker she is done everything that we have asked her to do to no avail.  She states that is still only about 50% improved from her initial evaluation.  Objective: Vital signs are stable alert and oriented x3.  Pulses are palpable.  She  still has sharp piercing pain on palpation of the posterior aspect of the Achilles at its insertion site medially and laterally as well as centrally.  She has no proximal pain on palpation of the tendon itself.  Assessment: Most likely an insertional intrasubstance tear of the Achilles at its insertion however the only way to know this is MRI.  Plan: Requesting MRI of the right Achilles tendinous insertion highly suspect a tear secondary to failure of conservative therapy to alleviate her symptomatology.  She will notify us if she does not hear from the scheduling cen          PRECAUTIONS: None  SUBJECTIVE: No change to note  PAIN:  Are you having pain? 4/10, ankle/achilles insertion, worse with steps(concentric)   OBJECTIVE: (objective measures completed at initial evaluation unless otherwise dated)     OBJECTIVE:    DIAGNOSTIC FINDINGS:  08/01/21 MRI: 1. No acute injury of the Achilles tendon. Mild enthesopathic changes of the Achilles tendon insertion. 2.  No acute osseous injury of the right ankle. Past treatments: Medrol Dosepak, Cam walker, Meloxicam, HEP   PATIENT SURVEYS:  FOTO 59% function   COGNITION:           Overall cognitive status: Within functional limits for tasks assessed  SENSATION: WFL   EDEMA: none     POSTURE:  overweight; limited arm swing   PALPATION: Hypermobile metatarsals R; toe flexion/ext WNL and = bil; calf/achilles not tender to palpation, plantar fascia tighter R but not tender; PROM R ankle = compared to L WNL   LOWER EXTREMITY MMT:   Active ROM Right eval Left eval  Hip flexion 5/5 5/5  Hip extension 3+/5 3+/5  Hip abduction      Hip adduction      Hip internal rotation      Hip external rotation      Knee flexion 5/5 5/5  Knee extension 5/5 5/5  Ankle dorsiflexion 5/5 5/5  Ankle plantarflexion 5/5 5/5  Ankle inversion 5/5 5/5  Ankle eversion 5/5 5/5   (Blank rows = not tested) Pt also able to  do standing calf/toe raises without difficulty   LOWER EXTREMITY ROM: all measured supine              L calcaneal PROM inversion 10 and eversion 11; R inversion 18 and eversion 10 (measured in degrees)            Resting calcaneal position L 2 degrees and R 6 degrees Knee AROM WNL AND FULL   BALANCE: Pt unable to SLS without UE A with noticeable ankle instability     GAIT: Distance walked: IN GYM Assistive device utilized: None Level of assistance: Complete Independence Comments: coxa vara, genu valga, flat footed bil in sneakers with ankle instability noted; without shoes, L calcaneal eversion       TODAY'S TREATMENT: [facility] Adult PT Treatment:                                                DATE: 10/16/21 Therapeutic Exercise: Nustep L 3 8 min PF against wall 15x DF against wall 15x Step ups 4" R 15x Lateral step ups 4" 15x Eccentric heel raise 15x Runners step R 4" 15x Tandem stance from airex 30s each position, EO Manual Therapy: R subtalar mobs to promote eversion    PATIENT EDUCATION:  Education details: discussed PT POC, answered questions regarding anatomy, advised pt to wear supportive shoes at all times for now; issued HEP Person educated: Patient Education method: Explanation, Demonstration, and Handouts Education comprehension: verbalized understanding     HOME EXERCISE PROGRAM: Access Code: [email or web address] URL: [email or web address] Date: 10/10/2021 Prepared by: Lynn Reeves   Exercises - Arch Lifting  - 2 x daily - 7 x weekly - 1 sets - 10 reps - Seated Anterior Tibialis Stretch  - 2 x daily - 7 x weekly - 1 sets - 2-3 reps - Single Leg Stance  - 2 x daily - 7 x weekly - 1 sets - 2 reps - 30 sec hold - Towel Scrunches  - 2 x daily - 7 x weekly - 1 sets - 10 reps - Seated Plantar Fascia Stretch  - 1 x daily - 7 x weekly - 1 sets - 2 reps - 30 sec hold   ASSESSMENT:   CLINICAL IMPRESSION: Today's session focused on manual techniques to promote  eversion and subtalar mobility.  Performed stretching and strengthening tasks to R ankle, added balance and proprioceptive training as well.  Able to perform all requested tasks w/o increased symptoms or LOB     OBJECTIVE IMPAIRMENTS Abnormal gait, decreased balance, decreased coordination,  decreased endurance, difficulty walking, and hypermobility .    ACTIVITY LIMITATIONS locomotion level   PARTICIPATION LIMITATIONS:  walking   PERSONAL FACTORS 1 comorbidity: weight  are also affecting patient's functional outcome.    REHAB POTENTIAL: Good   CLINICAL DECISION MAKING: Stable/uncomplicated   EVALUATION COMPLEXITY: Low     GOALS: Goals reviewed with patient? Yes     LONG TERM GOALS: Target date:  11/21/21     Pt will be indep with HEP to assist with pain management                  Baseline: given at eval Goal status: INITIAL   2.  Pt will be able to walk 1 hour with </=3/10 pain Baseline: 8/10 Goal status: INITIAL   3.  Pt will be able to SLS >20 R LE without UE assist to assist with improved gait with reduced pain Baseline: unable Goal status: INITIAL   4.  Pt will demo improved foto score to 72% function Baseline: 59% function Goal status: INITIAL   5.  Pt will have improved resting calcaneal position to </=3 degrees to assist with more normalized gait Baseline: 6 degrees Goal status: INITIAL         PLAN: PT FREQUENCY: 1-2x/week   PT DURATION: 6 weeks   PLANNED INTERVENTIONS: Therapeutic exercises, Therapeutic activity, Neuromuscular re-education, Balance training, Gait training, Patient/Family education, Joint mobilization, Dry Needling, Cryotherapy, Moist heat, Taping, Ultrasound, Ionotophoresis 4mg /ml Dexamethasone, Manual therapy, and Re-evaluation   PLAN FOR NEXT SESSION: review HEP; progress metatarsal strengthening, stretch invertors all to R LE    Hildred LaserJeffrey M Oley [place]ie, PT 10/16/2021, 2:03 PM

## 2021-10-16 ENCOUNTER — Ambulatory Visit: Payer: BC Managed Care – PPO

## 2021-10-16 DIAGNOSIS — R278 Other lack of coordination: Secondary | ICD-10-CM

## 2021-10-16 DIAGNOSIS — R2689 Other abnormalities of gait and mobility: Secondary | ICD-10-CM | POA: Diagnosis not present

## 2021-10-16 DIAGNOSIS — M7661 Achilles tendinitis, right leg: Secondary | ICD-10-CM | POA: Diagnosis not present

## 2021-10-21 ENCOUNTER — Ambulatory Visit: Payer: BC Managed Care – PPO

## 2021-10-21 DIAGNOSIS — M7661 Achilles tendinitis, right leg: Secondary | ICD-10-CM | POA: Diagnosis not present

## 2021-10-21 DIAGNOSIS — R2689 Other abnormalities of gait and mobility: Secondary | ICD-10-CM | POA: Diagnosis not present

## 2021-10-21 DIAGNOSIS — R278 Other lack of coordination: Secondary | ICD-10-CM | POA: Diagnosis not present

## 2021-10-21 NOTE — Therapy (Signed)
OUTPATIENT PHYSICAL THERAPY TREATMENT NOTE   Patient Name: Lynn Reeves MRN: [id number] DOB:1976/02/25, 46 y.o., female Today's Date: 10/21/2021  PCP: Deatra James, MD REFERRING PROVIDER: Ernestene Kiel, DPM  END OF SESSION:   PT End of Session - 10/21/21 0749     Visit Number 3    Number of Visits 12    Date for PT Re-Evaluation 11/21/21    Authorization Type BCBS    Progress Note Due on Visit 10    PT Start Time 0749    PT Stop Time 0827    PT Time Calculation (min) 38 min    Activity Tolerance Patient tolerated treatment well;No increased pain    Behavior During Therapy [facility] for tasks assessed/performed              Past Medical History:  Diagnosis Date   Anxiety 04/16/2021   Arthritis    Asthma    Gastroesophageal reflux disease without esophagitis 04/16/2021   Hypertension    History reviewed. No pertinent surgical history. Patient Active Problem List   Diagnosis Date Noted   Anxiety 04/16/2021   Gastroesophageal reflux disease without esophagitis 04/16/2021   Morbid obesity (HCC) 04/16/2021   Osteoarthritis of knee 04/16/2021   Diverticulitis 01/29/2012   Enteritis 04/25/2011    Class: Acute   Hypertension 04/25/2011   Leucocytosis 04/25/2011    REFERRING DIAG: M76.61 (ICD-10-CM) - Achilles tendinitis of right lower extremity  THERAPY DIAG: Other abnormalities of gait and mobility   Other lack of coordination   Rationale for Evaluation and Treatment Rehabilitation  PERTINENT HISTORY: Encounter Date:  07/14/2021   Signed                 Lynn Reeves presents today for follow-up of her insertional Achilles tendinitis of her right heel.  She states that really is the same she took the Medrol Dosepak she has tried the cam walker she is done everything that we have asked her to do to no avail.  She states that is still only about 50% improved from her initial evaluation.  Objective: Vital signs are stable alert and oriented x3.  Pulses are palpable.  She  still has sharp piercing pain on palpation of the posterior aspect of the Achilles at its insertion site medially and laterally as well as centrally.  She has no proximal pain on palpation of the tendon itself.  Assessment: Most likely an insertional intrasubstance tear of the Achilles at its insertion however the only way to know this is MRI.  Plan: Requesting MRI of the right Achilles tendinous insertion highly suspect a tear secondary to failure of conservative therapy to alleviate her symptomatology.  She will notify us if she does not hear from the scheduling cen          PRECAUTIONS: None  SUBJECTIVE: Pt reports doing her HEP every day. She reports continued posterior heel pain today.  PAIN:  Are you having pain? 4-5/10, ankle/achilles insertion, worse with steps(concentric)   OBJECTIVE: (objective measures completed at initial evaluation unless otherwise dated)     OBJECTIVE:    DIAGNOSTIC FINDINGS:  08/01/21 MRI: 1. No acute injury of the Achilles tendon. Mild enthesopathic changes of the Achilles tendon insertion. 2.  No acute osseous injury of the right ankle. Past treatments: Medrol Dosepak, Cam walker, Meloxicam, HEP   PATIENT SURVEYS:  FOTO 59% function   COGNITION:           Overall cognitive status: Within functional limits for tasks assessed  SENSATION: WFL   EDEMA: none     POSTURE:  overweight; limited arm swing   PALPATION: Hypermobile metatarsals R; toe flexion/ext WNL and = bil; calf/achilles not tender to palpation, plantar fascia tighter R but not tender; PROM R ankle = compared to L WNL   LOWER EXTREMITY MMT:   Active ROM Right eval Left eval  Hip flexion 5/5 5/5  Hip extension 3+/5 3+/5  Hip abduction      Hip adduction      Hip internal rotation      Hip external rotation      Knee flexion 5/5 5/5  Knee extension 5/5 5/5  Ankle dorsiflexion 5/5 5/5  Ankle plantarflexion 5/5 5/5  Ankle inversion 5/5 5/5   Ankle eversion 5/5 5/5   (Blank rows = not tested) Pt also able to do standing calf/toe raises without difficulty   LOWER EXTREMITY ROM: all measured supine              L calcaneal PROM inversion 10 and eversion 11; R inversion 18 and eversion 10 (measured in degrees)            Resting calcaneal position L 2 degrees and R 6 degrees Knee AROM WNL AND FULL   BALANCE: Pt unable to SLS without UE A with noticeable ankle instability     GAIT: Distance walked: IN GYM Assistive device utilized: None Level of assistance: Complete Independence Comments: coxa vara, genu valga, flat footed bil in sneakers with ankle instability noted; without shoes, L calcaneal eversion       TODAY'S TREATMENT:  [facility] Adult PT Treatment:                                                DATE: 10/21/2021 Therapeutic Exercise: Long-sitting plantar fascia stretch with sheet x2 min on Rt Kickstand stance on Airex pad with Rt foot forward on edge of pad with 5# kettlebell lateral hand-offs 2x20 Standing marching with slow return on Airex pad 2x20 Seated Rt ankle inversion with towel and 5# kettlebell x5 Seated Rt ankle eversion with towel and 5# kettlebell x5 Eccentric heel raises on edge of Airex pad 3x20 Standing gastroc slantboard stretch x37min Manual Therapy: N/A Neuromuscular re-ed: N/A Therapeutic Activity: N/A Modalities: N/A Self Care: N/A   [facility] Adult PT Treatment:                                                DATE: 10/16/21 Therapeutic Exercise: Nustep L 3 8 min PF against wall 15x DF against wall 15x Step ups 4" R 15x Lateral step ups 4" 15x Eccentric heel raise 15x Runners step R 4" 15x Tandem stance from airex 30s each position, EO Manual Therapy: R subtalar mobs to promote eversion    PATIENT EDUCATION:  Education details: discussed PT POC, answered questions regarding anatomy, advised pt to wear supportive shoes at all times for now; issued HEP Person educated:  Patient Education method: Explanation, Demonstration, and Handouts Education comprehension: verbalized understanding     HOME EXERCISE PROGRAM: Access Code: [email or web address] URL: [email or web address] Date: 10/10/2021 Prepared by: Luna Fuse   Exercises - Arch Lifting  - 2 x daily - 7 x weekly - 1 sets - 10 reps -  Seated Anterior Tibialis Stretch  - 2 x daily - 7 x weekly - 1 sets - 2-3 reps - Single Leg Stance  - 2 x daily - 7 x weekly - 1 sets - 2 reps - 30 sec hold - Towel Scrunches  - 2 x daily - 7 x weekly - 1 sets - 10 reps - Seated Plantar Fascia Stretch  - 1 x daily - 7 x weekly - 1 sets - 2 reps - 30 sec hold   ASSESSMENT:   CLINICAL IMPRESSION: Pt responded excellently to all progressed interventions today, demonstrating good form and no increase in pain with selected exercises. She will continue to benefit from skilled PT to address her primary impairments and return to her prior level of function with less limitation.     OBJECTIVE IMPAIRMENTS Abnormal gait, decreased balance, decreased coordination, decreased endurance, difficulty walking, and hypermobility .    ACTIVITY LIMITATIONS locomotion level   PARTICIPATION LIMITATIONS:  walking   PERSONAL FACTORS 1 comorbidity: weight  are also affecting patient's functional outcome.        GOALS: Goals reviewed with patient? Yes     LONG TERM GOALS: Target date:  11/21/21     Pt will be indep with HEP to assist with pain management                  Baseline: given at eval Goal status: INITIAL   2.  Pt will be able to walk 1 hour with </=3/10 pain Baseline: 8/10 Goal status: INITIAL   3.  Pt will be able to SLS >20 R LE without UE assist to assist with improved gait with reduced pain Baseline: unable Goal status: INITIAL   4.  Pt will demo improved foto score to 72% function Baseline: 59% function Goal status: INITIAL   5.  Pt will have improved resting calcaneal position to </=3 degrees to assist  with more normalized gait Baseline: 6 degrees Goal status: INITIAL         PLAN: PT FREQUENCY: 1-2x/week   PT DURATION: 6 weeks   PLANNED INTERVENTIONS: Therapeutic exercises, Therapeutic activity, Neuromuscular re-education, Balance training, Gait training, Patient/Family education, Joint mobilization, Dry Needling, Cryotherapy, Moist heat, Taping, Ultrasound, Ionotophoresis 4mg /ml Dexamethasone, Manual therapy, and Re-evaluation   PLAN FOR NEXT SESSION: review HEP; progress metatarsal strengthening, stretch invertors all to R LE    , PT 10/21/2021, 8:31 AM

## 2021-10-27 DIAGNOSIS — K648 Other hemorrhoids: Secondary | ICD-10-CM | POA: Diagnosis not present

## 2021-10-27 DIAGNOSIS — Z1211 Encounter for screening for malignant neoplasm of colon: Secondary | ICD-10-CM | POA: Diagnosis not present

## 2021-10-31 ENCOUNTER — Ambulatory Visit: Payer: BC Managed Care – PPO | Attending: Podiatry | Admitting: Rehabilitative and Restorative Service Providers"

## 2021-10-31 ENCOUNTER — Encounter: Payer: Self-pay | Admitting: Rehabilitative and Restorative Service Providers"

## 2021-10-31 DIAGNOSIS — R2689 Other abnormalities of gait and mobility: Secondary | ICD-10-CM | POA: Diagnosis not present

## 2021-10-31 DIAGNOSIS — R278 Other lack of coordination: Secondary | ICD-10-CM | POA: Diagnosis not present

## 2021-10-31 NOTE — Therapy (Signed)
[facility] [address] [place], [place], [address] Phone: [phone]   Fax:  [phone]  Patient Details  Name: Lynn Reeves MRN: [id number] Date of Birth: [birth year] Referring Provider:  Cain Saupe, MD  Encounter Date: 10/31/2021   OUTPATIENT PHYSICAL THERAPY TREATMENT NOTE   Patient Name: Lynn Reeves MRN: [id number] DOB:04/26/1976, 46 y.o., female Today's Date: 10/31/2021  PCP: Lynn James, MD REFERRING PROVIDER: Ernestene Reeves, DPM  END OF SESSION:   PT End of Session - 10/31/21 1038     Visit Number 4    Number of Visits 12    Date for PT Re-Evaluation 11/21/21    Authorization Type BCBS    PT Start Time 1036    PT Stop Time 1117    PT Time Calculation (min) 41 min    Activity Tolerance Patient tolerated treatment well;No increased pain    Behavior During Therapy [facility] for tasks assessed/performed               Past Medical History:  Diagnosis Date   Anxiety 04/16/2021   Arthritis    Asthma    Gastroesophageal reflux disease without esophagitis 04/16/2021   Hypertension    History reviewed. No pertinent surgical history. Patient Active Problem List   Diagnosis Date Noted   Anxiety 04/16/2021   Gastroesophageal reflux disease without esophagitis 04/16/2021   Morbid obesity (HCC) 04/16/2021   Osteoarthritis of knee 04/16/2021   Diverticulitis 01/29/2012   Enteritis 04/25/2011    Class: Acute   Hypertension 04/25/2011   Leucocytosis 04/25/2011    REFERRING DIAG: M76.61 (ICD-10-CM) - Achilles tendinitis of right lower extremity  THERAPY DIAG: Other abnormalities of gait and mobility   Other lack of coordination   Rationale for Evaluation and Treatment Rehabilitation  PERTINENT HISTORY: Encounter Date:  07/14/2021   Signed                 Lynn Reeves presents today for follow-up of her insertional Achilles tendinitis of her right heel.  She states that really is the same she took the  Medrol Dosepak she has tried the cam walker she is done everything that we have asked her to do to no avail.  She states that is still only about 50% improved from her initial evaluation.  Objective: Vital signs are stable alert and oriented x3.  Pulses are palpable.  She still has sharp piercing pain on palpation of the posterior aspect of the Achilles at its insertion site medially and laterally as well as centrally.  She has no proximal pain on palpation of the tendon itself.  Assessment: Most likely an insertional intrasubstance tear of the Achilles at its insertion however the only way to know this is MRI.  Plan: Requesting MRI of the right Achilles tendinous insertion highly suspect a tear secondary to failure of conservative therapy to alleviate her symptomatology.  She will notify us if she does not hear from the scheduling cen          PRECAUTIONS: None  SUBJECTIVE: Pt reports doing her HEP every day. She reports continued posterior heel pain. Noticing minimal improvement.   PAIN:  Are you having pain? 4/10, ankle/achilles insertion, worse with steps(concentric)   OBJECTIVE: (objective measures completed at initial evaluation unless otherwise dated)     OBJECTIVE:    DIAGNOSTIC FINDINGS:  08/01/21 MRI: 1. No acute injury of the Achilles tendon. Mild enthesopathic changes of the Achilles tendon insertion. 2.  No acute osseous injury of the right ankle. Past  treatments: Medrol Dosepak, Cam walker, Meloxicam, HEP   PATIENT SURVEYS:  FOTO 59% function   COGNITION:           Overall cognitive status: Within functional limits for tasks assessed                          SENSATION: WFL   EDEMA: none     POSTURE:  overweight; limited arm swing   PALPATION: Hypermobile metatarsals R; toe flexion/ext WNL and = bil; calf/achilles not tender to palpation, plantar fascia tighter R but not tender; PROM R ankle = compared to L WNL   LOWER EXTREMITY MMT:   Active ROM  Right eval Left eval  Hip flexion 5/5 5/5  Hip extension 3+/5 3+/5  Hip abduction      Hip adduction      Hip internal rotation      Hip external rotation      Knee flexion 5/5 5/5  Knee extension 5/5 5/5  Ankle dorsiflexion 5/5 5/5  Ankle plantarflexion 5/5 5/5  Ankle inversion 5/5 5/5  Ankle eversion 5/5 5/5   (Blank rows = not tested) Pt also able to do standing calf/toe raises without difficulty   LOWER EXTREMITY ROM: all measured supine              L calcaneal PROM inversion 10 and eversion 11; R inversion 18 and eversion 10 (measured in degrees)            Resting calcaneal position L 2 degrees and R 6 degrees Knee AROM WNL AND FULL   BALANCE: Pt unable to SLS without UE A with noticeable ankle instability     GAIT: Distance walked: IN GYM Assistive device utilized: None Level of assistance: Complete Independence Comments: coxa vara, genu valga, flat footed bil in sneakers with ankle instability noted; without shoes, L calcaneal eversion       TODAY'S TREATMENT:  [facility] Adult PT Treatment:                                                DATE: 10/31/21 Therapeutic Exercise: NuStep LEs only level 5 with PT discussing differences in between strengthening and stretching workouts and the focus Slant board stretches bil 3x30 sec R single limb arch raising/flattening exercise x 20 at the counter Standing R toe ext 1-5 x 20 Standing R ankle eversion x 20 Supine R PF/eversion stretch combo to stretch R invertors x20 R RTB eversion x 20 R toe curl/splay AROM x 20 Supine R gastroc stretch 3x30 sec with towel Manual Therapy: R talocrural joint stretch R ankle invertor stretch Soft tissue work R invertors Manual R calcaneal lateral mobs and static stretch R achilles and invertor soft tissue work Pt states that she feels stiff when she is sitting at work and gets up; PT advised to do some seated calf/toe raises 5-10 to help to loosen her up prior and to increase her  frequency with her HEP from 2-3x/day as able.    [facility] PT Treatment:                                                DATE: 10/21/2021 Therapeutic Exercise: Long-sitting  plantar fascia stretch with sheet x2 min on Rt Kickstand stance on Airex pad with Rt foot forward on edge of pad with 5# kettlebell lateral hand-offs 2x20 Standing marching with slow return on Airex pad 2x20 Seated Rt ankle inversion with towel and 5# kettlebell x5 Seated Rt ankle eversion with towel and 5# kettlebell x5 Eccentric heel raises on edge of Airex pad 3x20 Standing gastroc slantboard stretch x38min Manual Therapy: N/A Neuromuscular re-ed: N/A Therapeutic Activity: N/A Modalities: N/A Self Care: N/A   [facility] Adult PT Treatment:                                                DATE: 10/16/21 Therapeutic Exercise: Nustep L 3 8 min PF against wall 15x DF against wall 15x Step ups 4" R 15x Lateral step ups 4" 15x Eccentric heel raise 15x Runners step R 4" 15x Tandem stance from airex 30s each position, EO Manual Therapy: R subtalar mobs to promote eversion    PATIENT EDUCATION:  Education details: discussed PT POC, answered questions regarding anatomy, advised pt to wear supportive shoes at all times for now; issued HEP Person educated: Patient Education method: Explanation, Demonstration, and Handouts Education comprehension: verbalized understanding     HOME EXERCISE PROGRAM: Access Code: [email or web address] URL: [email or web address] Date: 10/10/2021 Prepared by: Luna Fuse   Exercises - Arch Lifting  - 2 x daily - 7 x weekly - 1 sets - 10 reps - Seated Anterior Tibialis Stretch  - 2 x daily - 7 x weekly - 1 sets - 2-3 reps - Single Leg Stance  - 2 x daily - 7 x weekly - 1 sets - 2 reps - 30 sec hold - Towel Scrunches  - 2 x daily - 7 x weekly - 1 sets - 10 reps - Seated Plantar Fascia Stretch  - 1 x daily - 7 x weekly - 1 sets - 2 reps - 30 sec hold   ASSESSMENT:   CLINICAL  IMPRESSION: Pt calcaneal resting position observed to be much better and more in neutral than at the eval. She continues to complain of achilles pain. She will continue to benefit from skilled PT to address her primary impairments and return to her prior level of function with less limitation.     OBJECTIVE IMPAIRMENTS Abnormal gait, decreased balance, decreased coordination, decreased endurance, difficulty walking, and hypermobility .    ACTIVITY LIMITATIONS locomotion level   PARTICIPATION LIMITATIONS:  walking   PERSONAL FACTORS 1 comorbidity: weight  are also affecting patient's functional outcome.        GOALS: Goals reviewed with patient? Yes     LONG TERM GOALS: Target date:  11/21/21     Pt will be indep with HEP to assist with pain management                  Baseline: given at eval Goal status: INITIAL   2.  Pt will be able to walk 1 hour with </=3/10 pain Baseline: 8/10 Goal status: INITIAL   3.  Pt will be able to SLS >20 R LE without UE assist to assist with improved gait with reduced pain Baseline: unable Goal status: INITIAL   4.  Pt will demo improved foto score to 72% function Baseline: 59% function Goal status: INITIAL   5.  Pt will have improved resting calcaneal position to </=  3 degrees to assist with more normalized gait Baseline: 6 degrees Goal status: INITIAL         PLAN: PT FREQUENCY: 1-2x/week   PT DURATION: 6 weeks   PLANNED INTERVENTIONS: Therapeutic exercises, Therapeutic activity, Neuromuscular re-education, Balance training, Gait training, Patient/Family education, Joint mobilization, Dry Needling, Cryotherapy, Moist heat, Taping, Ultrasound, Ionotophoresis 4mg /ml Dexamethasone, Manual therapy, and Re-evaluation   PLAN FOR NEXT SESSION: progress metatarsal strengthening, stretch invertors R LE, to achilles R next visit, calcaneal mobs continue    [place], PT 10/31/2021, 11:22 AM       01/01/2022, PT 10/31/2021, 11:22  AM  [facility] [address] [place], [place], [place] Phone: [phone]   Fax:  [phone]

## 2021-11-07 ENCOUNTER — Ambulatory Visit: Payer: BC Managed Care – PPO

## 2021-11-07 DIAGNOSIS — R278 Other lack of coordination: Secondary | ICD-10-CM

## 2021-11-07 DIAGNOSIS — R2689 Other abnormalities of gait and mobility: Secondary | ICD-10-CM

## 2021-11-07 NOTE — Therapy (Signed)
[facility] Outpatient Rehabilitation [facility] [address] [place], [place], [address] Phone: [phone]   Fax:  [phone]  Patient Details  Name: Lynn Reeves MRN: [id number] Date of Birth: Aug 10, 1975 Referring Provider:  Cain Saupe, MD  Encounter Date: 11/07/2021   OUTPATIENT PHYSICAL THERAPY TREATMENT NOTE   Patient Name: Lynn Reeves MRN: [id number] DOB:[birth year], 46 y.o., female Today's Date: 11/07/2021  PCP: Deatra James, MD REFERRING PROVIDER: Ernestene Kiel, DPM  END OF SESSION:   PT End of Session - 11/07/21 0947     Visit Number 5    Number of Visits 12    Date for PT Re-Evaluation 11/21/21    Authorization Type [employer]    Progress Note Due on Visit 10    PT Start Time [phone]    PT Stop Time 1027    PT Time Calculation (min) 40 min    Activity Tolerance Patient tolerated treatment well;No increased pain    Behavior During Therapy [facility] for tasks assessed/performed                Past Medical History:  Diagnosis Date   Anxiety 04/16/2021   Arthritis    Asthma    Gastroesophageal reflux disease without esophagitis 04/16/2021   Hypertension    History reviewed. No pertinent surgical history. Patient Active Problem List   Diagnosis Date Noted   Anxiety 04/16/2021   Gastroesophageal reflux disease without esophagitis 04/16/2021   Morbid obesity (HCC) 04/16/2021   Osteoarthritis of knee 04/16/2021   Diverticulitis 01/29/2012   Enteritis 04/25/2011    Class: Acute   Hypertension 04/25/2011   Leucocytosis 04/25/2011    REFERRING DIAG: M76.61 (ICD-10-CM) - Achilles tendinitis of right lower extremity  THERAPY DIAG: Other abnormalities of gait and mobility   Other lack of coordination   Rationale for Evaluation and Treatment Rehabilitation  PERTINENT HISTORY: Encounter Date:  07/14/2021   Signed                 [name] presents today for follow-up of her insertional Achilles tendinitis of her right heel.  She states that  really is the same she took the Medrol Dosepak she has tried the cam walker she is done everything that we have asked her to do to no avail.  She states that is still only about 50% improved from her initial evaluation.  Objective: Vital signs are stable alert and oriented x3.  Pulses are palpable.  She still has sharp piercing pain on palpation of the posterior aspect of the Achilles at its insertion site medially and laterally as well as centrally.  She has no proximal pain on palpation of the tendon itself.  Assessment: Most likely an insertional intrasubstance tear of the Achilles at its insertion however the only way to know this is MRI.  Plan: Requesting MRI of the right Achilles tendinous insertion highly suspect a tear secondary to failure of conservative therapy to alleviate her symptomatology.  She will notify us if she does not hear from the scheduling cen          PRECAUTIONS: None  SUBJECTIVE: Pt reports 6-7/10 pain today in her Rt heel. She reports that she felt good form two days after last session, but by Wednesday, her pain had started to increase again. Yesterday was a bad day for her pain.  PAIN:  Are you having pain? 6-7/10, ankle/achilles insertion, worse with steps(concentric)   OBJECTIVE: (objective measures completed at initial evaluation unless otherwise dated)     OBJECTIVE:  DIAGNOSTIC FINDINGS:  08/01/21 MRI: 1. No acute injury of the Achilles tendon. Mild enthesopathic changes of the Achilles tendon insertion. 2.  No acute osseous injury of the right ankle. Past treatments: Medrol Dosepak, Cam walker, Meloxicam, HEP   PATIENT SURVEYS:  FOTO 59% function   COGNITION:           Overall cognitive status: Within functional limits for tasks assessed                          SENSATION: WFL   EDEMA: none     POSTURE:  overweight; limited arm swing   PALPATION: Hypermobile metatarsals R; toe flexion/ext WNL and = bil; calf/achilles not tender to  palpation, plantar fascia tighter R but not tender; PROM R ankle = compared to L WNL   LOWER EXTREMITY MMT:   Active ROM Right eval Left eval  Hip flexion 5/5 5/5  Hip extension 3+/5 3+/5  Hip abduction      Hip adduction      Hip internal rotation      Hip external rotation      Knee flexion 5/5 5/5  Knee extension 5/5 5/5  Ankle dorsiflexion 5/5 5/5  Ankle plantarflexion 5/5 5/5  Ankle inversion 5/5 5/5  Ankle eversion 5/5 5/5   (Blank rows = not tested) Pt also able to do standing calf/toe raises without difficulty   LOWER EXTREMITY ROM: all measured supine              L calcaneal PROM inversion 10 and eversion 11; R inversion 18 and eversion 10 (measured in degrees)            Resting calcaneal position L 2 degrees and R 6 degrees Knee AROM WNL AND FULL   BALANCE: Pt unable to SLS without UE A with noticeable ankle instability     GAIT: Distance walked: IN GYM Assistive device utilized: None Level of assistance: Complete Independence Comments: coxa vara, genu valga, flat footed bil in sneakers with ankle instability noted; without shoes, L calcaneal eversion       TODAY'S TREATMENT:  [facility] Adult PT Treatment:                                                DATE: 11/07/2021 Therapeutic Exercise: Kickstand stance on Airex pad with lead foot off front edge of pad with 3# cable Pallof press 2x10 with 5-sec holds on Rt each side Eccentric heel raise on edge of Airex pad 3x12 Medial ankle slides with towel with 5# dumbbell 2x5 on Rt Lateral ankle slides with towel with 5# dumbbell 2x5 on Rt Manual Therapy: Short-to-long longitudinal stroke IASTM to Rt Achilles Tendon Rock tape calcaneal hammock strap with lateral I-band across distal Achilles tendon on Rt for passive support Neuromuscular re-ed: N/A Therapeutic Activity: N/A Modalities: N/A Self Care: N/A   [facility] Adult PT Treatment:                                                DATE: 10/31/21 Therapeutic  Exercise: NuStep LEs only level 5 with PT discussing differences in between strengthening and stretching workouts and the focus Slant board stretches bil 3x30 sec R  single limb arch raising/flattening exercise x 20 at the counter Standing R toe ext 1-5 x 20 Standing R ankle eversion x 20 Supine R PF/eversion stretch combo to stretch R invertors x20 R RTB eversion x 20 R toe curl/splay AROM x 20 Supine R gastroc stretch 3x30 sec with towel Manual Therapy: R talocrural joint stretch R ankle invertor stretch Soft tissue work R invertors Manual R calcaneal lateral mobs and static stretch R achilles and invertor soft tissue work Pt states that she feels stiff when she is sitting at work and gets up; PT advised to do some seated calf/toe raises 5-10 to help to loosen her up prior and to increase her frequency with her HEP from 2-3x/day as able.    [facility] Adult PT Treatment:                                                DATE: 10/21/2021 Therapeutic Exercise: Long-sitting plantar fascia stretch with sheet x2 min on Rt Kickstand stance on Airex pad with Rt foot forward on edge of pad with 5# kettlebell lateral hand-offs 2x20 Standing marching with slow return on Airex pad 2x20 Seated Rt ankle inversion with towel and 5# kettlebell x5 Seated Rt ankle eversion with towel and 5# kettlebell x5 Eccentric heel raises on edge of Airex pad 3x20 Standing gastroc slantboard stretch x76min Manual Therapy: N/A Neuromuscular re-ed: N/A Therapeutic Activity: N/A Modalities: N/A Self Care: N/A     PATIENT EDUCATION:  Education details: discussed PT POC, answered questions regarding anatomy, advised pt to wear supportive shoes at all times for now; issued HEP Person educated: Patient Education method: Explanation, Demonstration, and Handouts Education comprehension: verbalized understanding     HOME EXERCISE PROGRAM: Access Code: [email or web address] URL: [email or web address] Date:  10/10/2021 Prepared by: America Brown   Exercises - Arch Lifting  - 2 x daily - 7 x weekly - 1 sets - 10 reps - Seated Anterior Tibialis Stretch  - 2 x daily - 7 x weekly - 1 sets - 2-3 reps - Single Leg Stance  - 2 x daily - 7 x weekly - 1 sets - 2 reps - 30 sec hold - Towel Scrunches  - 2 x daily - 7 x weekly - 1 sets - 10 reps - Seated Plantar Fascia Stretch  - 1 x daily - 7 x weekly - 1 sets - 2 reps - 30 sec hold   ASSESSMENT:   CLINICAL IMPRESSION: Pt responded excellently to manual techniques and taping today, reporting improved pain and stability following these interventions. She also responded well to progress ankle strengthening exercises today. She will continue to benefit from skilled PT to address her primary impairments and return to her prior level of function with less limitation.     OBJECTIVE IMPAIRMENTS Abnormal gait, decreased balance, decreased coordination, decreased endurance, difficulty walking, and hypermobility .    ACTIVITY LIMITATIONS locomotion level   PARTICIPATION LIMITATIONS:  walking   PERSONAL FACTORS 1 comorbidity: weight  are also affecting patient's functional outcome.        GOALS: Goals reviewed with patient? Yes     LONG TERM GOALS: Target date:  11/21/21     Pt will be indep with HEP to assist with pain management                  Baseline: given at eval  11/07/2021: Pt reports adherence to her HEP Goal status: ACHIEVED   2.  Pt will be able to walk 1 hour with </=3/10 pain Baseline: 8/10 Goal status: INITIAL   3.  Pt will be able to SLS >20 R LE without UE assist to assist with improved gait with reduced pain Baseline: unable Goal status: INITIAL   4.  Pt will demo improved foto score to 72% function Baseline: 59% function Goal status: INITIAL   5.  Pt will have improved resting calcaneal position to </=3 degrees to assist with more normalized gait Baseline: 6 degrees Goal status: INITIAL         PLAN: PT FREQUENCY:  1-2x/week   PT DURATION: 6 weeks   PLANNED INTERVENTIONS: Therapeutic exercises, Therapeutic activity, Neuromuscular re-education, Balance training, Gait training, Patient/Family education, Joint mobilization, Dry Needling, Cryotherapy, Moist heat, Taping, Ultrasound, Ionotophoresis 4mg /ml Dexamethasone, Manual therapy, and Re-evaluation   PLAN FOR NEXT SESSION: progress metatarsal strengthening, stretch invertors R LE, [place] to achilles R next visit, calcaneal mobs continue    Cherie Ouch, PT 11/07/2021, 10:31 AM  [place], [place], [address] Phone: [phone]   Fax:  [phone]

## 2021-11-14 ENCOUNTER — Ambulatory Visit: Payer: BC Managed Care – PPO | Admitting: Rehabilitative and Restorative Service Providers"

## 2021-11-14 ENCOUNTER — Encounter: Payer: Self-pay | Admitting: Rehabilitative and Restorative Service Providers"

## 2021-11-14 DIAGNOSIS — R2689 Other abnormalities of gait and mobility: Secondary | ICD-10-CM

## 2021-11-14 DIAGNOSIS — R278 Other lack of coordination: Secondary | ICD-10-CM

## 2021-11-14 NOTE — Therapy (Signed)
[facility] Outpatient Rehabilitation [facility] [address] [place], [place], [address] Phone: [phone]   Fax:  [phone]  Patient Details  Name: Lynn Reeves MRN: [id number] Date of Birth: 07-04-1975 Referring Provider:  Cain Saupe, MD  Encounter Date: 11/14/2021   OUTPATIENT PHYSICAL THERAPY TREATMENT NOTE   Patient Name: Lynn Reeves MRN: [id number] DOB:1975-12-20, 46 y.o., female Today's Date: 11/14/2021  PCP: Deatra James, MD REFERRING PROVIDER: Ernestene Kiel, DPM  END OF SESSION:   PT End of Session - 11/14/21 1037     Visit Number 6    Number of Visits 12    Date for PT Re-Evaluation 11/21/21    Progress Note Due on Visit 10    PT Start Time 1034    PT Stop Time 1116    PT Time Calculation (min) 42 min    Activity Tolerance Patient tolerated treatment well;No increased pain    Behavior During Therapy [facility] for tasks assessed/performed                 Past Medical History:  Diagnosis Date   Anxiety 04/16/2021   Arthritis    Asthma    Gastroesophageal reflux disease without esophagitis 04/16/2021   Hypertension    History reviewed. No pertinent surgical history. Patient Active Problem List   Diagnosis Date Noted   Anxiety 04/16/2021   Gastroesophageal reflux disease without esophagitis 04/16/2021   Morbid obesity (HCC) 04/16/2021   Osteoarthritis of knee 04/16/2021   Diverticulitis 01/29/2012   Enteritis 04/25/2011    Class: Acute   Hypertension 04/25/2011   Leucocytosis 04/25/2011    REFERRING DIAG: M76.61 (ICD-10-CM) - Achilles tendinitis of right lower extremity  THERAPY DIAG: Other abnormalities of gait and mobility   Other lack of coordination   Rationale for Evaluation and Treatment Rehabilitation  PERTINENT HISTORY: Encounter Date:  07/14/2021   Signed                 Lynn Reeves presents today for follow-up of her insertional Achilles tendinitis of her right heel.  She states that really is the same she  took the Medrol Dosepak she has tried the cam walker she is done everything that we have asked her to do to no avail.  She states that is still only about 50% improved from her initial evaluation.  Objective: Vital signs are stable alert and oriented x3.  Pulses are palpable.  She still has sharp piercing pain on palpation of the posterior aspect of the Achilles at its insertion site medially and laterally as well as centrally.  She has no proximal pain on palpation of the tendon itself.  Assessment: Most likely an insertional intrasubstance tear of the Achilles at its insertion however the only way to know this is MRI.  Plan: Requesting MRI of the right Achilles tendinous insertion highly suspect a tear secondary to failure of conservative therapy to alleviate her symptomatology.  She will notify us if she does not hear from the scheduling cen          PRECAUTIONS: None  SUBJECTIVE: It is getting better.   PAIN:  Are you having pain? 3-4/10, ankle/achilles insertion, worse with steps(concentric)   OBJECTIVE: (objective measures completed at initial evaluation unless otherwise dated)     OBJECTIVE:    DIAGNOSTIC FINDINGS:  08/01/21 MRI: 1. No acute injury of the Achilles tendon. Mild enthesopathic changes of the Achilles tendon insertion. 2.  No acute osseous injury of the right ankle. Past treatments: Medrol Dosepak, Cam walker, Meloxicam, HEP  PATIENT SURVEYS:  FOTO 59% function   COGNITION:           Overall cognitive status: Within functional limits for tasks assessed                          SENSATION: WFL   EDEMA: none     POSTURE:  overweight; limited arm swing   PALPATION: Hypermobile metatarsals R; toe flexion/ext WNL and = bil; calf/achilles not tender to palpation, plantar fascia tighter R but not tender; PROM R ankle = compared to L WNL   LOWER EXTREMITY MMT:   Active ROM Right eval Left eval  Hip flexion 5/5 5/5  Hip extension 3+/5 3+/5  Hip  abduction      Hip adduction      Hip internal rotation      Hip external rotation      Knee flexion 5/5 5/5  Knee extension 5/5 5/5  Ankle dorsiflexion 5/5 5/5  Ankle plantarflexion 5/5 5/5  Ankle inversion 5/5 5/5  Ankle eversion 5/5 5/5   (Blank rows = not tested) Pt also able to do standing calf/toe raises without difficulty   LOWER EXTREMITY ROM: all measured supine              L calcaneal PROM inversion 10 and eversion 11; R inversion 18 and eversion 10 (measured in degrees)            Resting calcaneal position L 2 degrees and R 6 degrees Knee AROM WNL AND FULL   BALANCE: Pt unable to SLS without UE A with noticeable ankle instability     GAIT: Distance walked: IN GYM Assistive device utilized: None Level of assistance: Complete Independence Comments: coxa vara, genu valga, flat footed bil in sneakers with ankle instability noted; without shoes, L calcaneal eversion       TODAY'S TREATMENT:  [facility] Adult PT Treatment:                                                DATE: 11/14/21 Therapeutic Exercise: NuStep LEs only x 5 min level 6 while discussing pain Airex R eccentric heel raise 3 x 20 at counter with heel off Airex R SLS with bil UE abdct/addct x 20 Airex squats x 20 6 inch step with Airex pad on top performing R knee bends 2x20 Slant board stretch 2x30 sec Airex pad NBOS with bil GTB row x 20 Airex pad iso minisquat with GTB bil bicep curls x 20 Static lunges R x 20 at counter Counter R iso toe heel raise with hip hinge over counter with glute set x 20 Dynamic lunges with concentration on foot positioning and balancing with each transition Slant board 2x30 sec Supine R calf stretch with sheet in neutral and then pronating foot to stretch R invertors 2x30 sec each direction Manual Therapy: R foot invertor stretch, plantar fascia stretch, calcaneal mobs grade 2   [facility] Adult PT Treatment:                                                DATE:  11/07/2021 Therapeutic Exercise: Kickstand stance on Airex pad with lead foot off front edge of pad with  3# cable Pallof press 2x10 with 5-sec holds on Rt each side Eccentric heel raise on edge of Airex pad 3x12 Medial ankle slides with towel with 5# dumbbell 2x5 on Rt Lateral ankle slides with towel with 5# dumbbell 2x5 on Rt Manual Therapy: Short-to-long longitudinal stroke IASTM to Rt Achilles Tendon Rock tape calcaneal hammock strap with lateral I-band across distal Achilles tendon on Rt for passive support Neuromuscular re-ed: N/A Therapeutic Activity: N/A Modalities: N/A Self Care: N/A   [facility] Adult PT Treatment:                                                DATE: 10/31/21 Therapeutic Exercise: NuStep LEs only level 5 with PT discussing differences in between strengthening and stretching workouts and the focus Slant board stretches bil 3x30 sec R single limb arch raising/flattening exercise x 20 at the counter Standing R toe ext 1-5 x 20 Standing R ankle eversion x 20 Supine R PF/eversion stretch combo to stretch R invertors x20 R RTB eversion x 20 R toe curl/splay AROM x 20 Supine R gastroc stretch 3x30 sec with towel Manual Therapy: R talocrural joint stretch R ankle invertor stretch Soft tissue work R invertors Manual R calcaneal lateral mobs and static stretch R achilles and invertor soft tissue work Pt states that she feels stiff when she is sitting at work and gets up; PT advised to do some seated calf/toe raises 5-10 to help to loosen her up prior and to increase her frequency with her HEP from 2-3x/day as able.    [facility] Adult PT Treatment:                                                DATE: 10/21/2021 Therapeutic Exercise: Long-sitting plantar fascia stretch with sheet x2 min on Rt Kickstand stance on Airex pad with Rt foot forward on edge of pad with 5# kettlebell lateral hand-offs 2x20 Standing marching with slow return on Airex pad 2x20 Seated Rt ankle  inversion with towel and 5# kettlebell x5 Seated Rt ankle eversion with towel and 5# kettlebell x5 Eccentric heel raises on edge of Airex pad 3x20 Standing gastroc slantboard stretch x48min Manual Therapy: N/A Neuromuscular re-ed: N/A Therapeutic Activity: N/A Modalities: N/A Self Care: N/A     PATIENT EDUCATION:  Education details: discussed PT POC, answered questions regarding anatomy, advised pt to wear supportive shoes at all times for now; issued HEP Person educated: Patient Education method: Explanation, Demonstration, and Handouts Education comprehension: verbalized understanding     HOME EXERCISE PROGRAM: Access Code: [email or web address] URL: [email or web address] Date: 10/10/2021 Prepared by: Luna Fuse   Exercises - Arch Lifting  - 2 x daily - 7 x weekly - 1 sets - 10 reps - Seated Anterior Tibialis Stretch  - 2 x daily - 7 x weekly - 1 sets - 2-3 reps - Single Leg Stance  - 2 x daily - 7 x weekly - 1 sets - 2 reps - 30 sec hold - Towel Scrunches  - 2 x daily - 7 x weekly - 1 sets - 10 reps - Seated Plantar Fascia Stretch  - 1 x daily - 7 x weekly - 1 sets - 2 reps - 30 sec hold  ASSESSMENT:   CLINICAL IMPRESSION: Pt responded well to progress with ankle strengthening exercises today. She will continue to benefit from skilled PT to address her primary impairments and return to her prior level of function with less limitation.     OBJECTIVE IMPAIRMENTS Abnormal gait, decreased balance, decreased coordination, decreased endurance, difficulty walking, and hypermobility .    ACTIVITY LIMITATIONS locomotion level   PARTICIPATION LIMITATIONS:  walking   PERSONAL FACTORS 1 comorbidity: weight  are also affecting patient's functional outcome.        GOALS: Goals reviewed with patient? Yes     LONG TERM GOALS: Target date:  11/21/21     Pt will be indep with HEP to assist with pain management                  Baseline: given at eval 11/07/2021: Pt  reports adherence to her HEP Goal status: ACHIEVED   2.  Pt will be able to walk 1 hour with </=3/10 pain Baseline: 8/10 Goal status: INITIAL   3.  Pt will be able to SLS >20 R LE without UE assist to assist with improved gait with reduced pain Baseline: unable Goal status: INITIAL   4.  Pt will demo improved foto score to 72% function Baseline: 59% function Goal status: INITIAL   5.  Pt will have improved resting calcaneal position to </=3 degrees to assist with more normalized gait Baseline: 6 degrees Goal status: INITIAL         PLAN: PT FREQUENCY: 1-2x/week   PT DURATION: 6 weeks   PLANNED INTERVENTIONS: Therapeutic exercises, Therapeutic activity, Neuromuscular re-education, Balance training, Gait training, Patient/Family education, Joint mobilization, Dry Needling, Cryotherapy, Moist heat, Taping, Ultrasound, Ionotophoresis 4mg /ml Dexamethasone, Manual therapy, and Re-evaluation   PLAN FOR NEXT SESSION: progress metatarsal strengthening, stretch invertors R LE, reeval, taping as prior session if pt desires    , PT 11/14/2021, 11:18 AM  [facility] [address] [place], [place], [place] Phone: [phone]   Fax:  [phone]

## 2021-11-17 NOTE — Therapy (Signed)
[facility] [address] [place], [place], [address] Phone: [phone]   Fax:  [phone]  Patient Details  Name: Lynn Reeves MRN: [id number] Date of Birth: 26-Apr-1976 Referring Provider:  Cain Saupe, MD  Encounter Date: 11/20/2021   OUTPATIENT PHYSICAL THERAPY TREATMENT NOTE   Patient Name: Lynn Reeves MRN: [id number] DOB:1976-04-20, 46 y.o., female Today's Date: 11/17/2021  PCP: Deatra James, MD REFERRING PROVIDER: Ernestene Kiel, DPM  END OF SESSION:         Past Medical History:  Diagnosis Date   Anxiety 04/16/2021   Arthritis    Asthma    Gastroesophageal reflux disease without esophagitis 04/16/2021   Hypertension    No past surgical history on file. Patient Active Problem List   Diagnosis Date Noted   Anxiety 04/16/2021   Gastroesophageal reflux disease without esophagitis 04/16/2021   Morbid obesity (HCC) 04/16/2021   Osteoarthritis of knee 04/16/2021   Diverticulitis 01/29/2012   Enteritis 04/25/2011    Class: Acute   Hypertension 04/25/2011   Leucocytosis 04/25/2011    REFERRING DIAG: M76.61 (ICD-10-CM) - Achilles tendinitis of right lower extremity  THERAPY DIAG: Other abnormalities of gait and mobility   Other lack of coordination   Rationale for Evaluation and Treatment Rehabilitation  PERTINENT HISTORY: Encounter Date:  07/14/2021   Signed                 Demeshia presents today for follow-up of her insertional Achilles tendinitis of her right heel.  She states that really is the same she took the Medrol Dosepak she has tried the cam walker she is done everything that we have asked her to do to no avail.  She states that is still only about 50% improved from her initial evaluation.  Objective: Vital signs are stable alert and oriented x3.  Pulses are palpable.  She still has sharp piercing pain on palpation of the posterior aspect of the Achilles at its insertion site medially and  laterally as well as centrally.  She has no proximal pain on palpation of the tendon itself.  Assessment: Most likely an insertional intrasubstance tear of the Achilles at its insertion however the only way to know this is MRI.  Plan: Requesting MRI of the right Achilles tendinous insertion highly suspect a tear secondary to failure of conservative therapy to alleviate her symptomatology.  She will notify us if she does not hear from the scheduling cen          PRECAUTIONS: None  SUBJECTIVE: It is getting better.   PAIN:  Are you having pain? 3-4/10, ankle/achilles insertion, worse with steps(concentric)   OBJECTIVE: (objective measures completed at initial evaluation unless otherwise dated)     OBJECTIVE:    DIAGNOSTIC FINDINGS:  08/01/21 MRI: 1. No acute injury of the Achilles tendon. Mild enthesopathic changes of the Achilles tendon insertion. 2.  No acute osseous injury of the right ankle. Past treatments: Medrol Dosepak, Cam walker, Meloxicam, HEP   PATIENT SURVEYS:  FOTO 59% function   COGNITION:           Overall cognitive status: Within functional limits for tasks assessed                          SENSATION: WFL   EDEMA: none     POSTURE:  overweight; limited arm swing   PALPATION: Hypermobile metatarsals R; toe flexion/ext WNL and = bil; calf/achilles not tender to palpation, plantar fascia tighter R  but not tender; PROM R ankle = compared to L WNL   LOWER EXTREMITY MMT:   Active ROM Right eval Left eval  Hip flexion 5/5 5/5  Hip extension 3+/5 3+/5  Hip abduction      Hip adduction      Hip internal rotation      Hip external rotation      Knee flexion 5/5 5/5  Knee extension 5/5 5/5  Ankle dorsiflexion 5/5 5/5  Ankle plantarflexion 5/5 5/5  Ankle inversion 5/5 5/5  Ankle eversion 5/5 5/5   (Blank rows = not tested) Pt also able to do standing calf/toe raises without difficulty   LOWER EXTREMITY ROM: all measured supine              L  calcaneal PROM inversion 10 and eversion 11; R inversion 18 and eversion 10 (measured in degrees)            Resting calcaneal position L 2 degrees and R 6 degrees Knee AROM WNL AND FULL   BALANCE: Pt unable to SLS without UE A with noticeable ankle instability     GAIT: Distance walked: IN GYM Assistive device utilized: None Level of assistance: Complete Independence Comments: coxa vara, genu valga, flat footed bil in sneakers with ankle instability noted; without shoes, L calcaneal eversion       TODAY'S TREATMENT:  [facility] Adult PT Treatment:                                                DATE: 11/14/21 Therapeutic Exercise: NuStep LEs only x 5 min level 6 while discussing pain Airex R eccentric heel raise 3 x 20 at counter with heel off Airex R SLS with bil UE abdct/addct x 20 Airex squats x 20 6 inch step with Airex pad on top performing R knee bends 2x20 Slant board stretch 2x30 sec Airex pad NBOS with bil GTB row x 20 Airex pad iso minisquat with GTB bil bicep curls x 20 Static lunges R x 20 at counter Counter R iso toe heel raise with hip hinge over counter with glute set x 20 Dynamic lunges with concentration on foot positioning and balancing with each transition Slant board 2x30 sec Supine R calf stretch with sheet in neutral and then pronating foot to stretch R invertors 2x30 sec each direction Manual Therapy: R foot invertor stretch, plantar fascia stretch, calcaneal mobs grade 2   [facility] Adult PT Treatment:                                                DATE: 11/07/2021 Therapeutic Exercise: Kickstand stance on Airex pad with lead foot off front edge of pad with 3# cable Pallof press 2x10 with 5-sec holds on Rt each side Eccentric heel raise on edge of Airex pad 3x12 Medial ankle slides with towel with 5# dumbbell 2x5 on Rt Lateral ankle slides with towel with 5# dumbbell 2x5 on Rt Manual Therapy: Short-to-long longitudinal stroke IASTM to Rt Achilles Tendon Rock tape  calcaneal hammock strap with lateral I-band across distal Achilles tendon on Rt for passive support Neuromuscular re-Lynn: N/A Therapeutic Activity: N/A Modalities: N/A Self Care: N/A   [facility] Adult PT Treatment:  DATE: 10/31/21 Therapeutic Exercise: NuStep LEs only level 5 with PT discussing differences in between strengthening and stretching workouts and the focus Slant board stretches bil 3x30 sec R single limb arch raising/flattening exercise x 20 at the counter Standing R toe ext 1-5 x 20 Standing R ankle eversion x 20 Supine R PF/eversion stretch combo to stretch R invertors x20 R RTB eversion x 20 R toe curl/splay AROM x 20 Supine R gastroc stretch 3x30 sec with towel Manual Therapy: R talocrural joint stretch R ankle invertor stretch Soft tissue work R invertors Manual R calcaneal lateral mobs and static stretch R achilles and invertor soft tissue work Pt states that she feels stiff when she is sitting at work and gets up; PT advised to do some seated calf/toe raises 5-10 to help to loosen her up prior and to increase her frequency with her HEP from 2-3x/day as able.    [facility] Adult [facility] Treatment:                                                DATE: 10/21/2021 Therapeutic Exercise: Long-sitting plantar fascia stretch with sheet x2 min on Rt Kickstand stance on Airex pad with Rt foot forward on edge of pad with 5# kettlebell lateral hand-offs 2x20 Standing marching with slow return on Airex pad 2x20 Seated Rt ankle inversion with towel and 5# kettlebell x5 Seated Rt ankle eversion with towel and 5# kettlebell x5 Eccentric heel raises on edge of Airex pad 3x20 Standing gastroc slantboard stretch x87min Manual Therapy: N/A Neuromuscular re-Lynn: N/A Therapeutic Activity: N/A Modalities: N/A Self Care: N/A     PATIENT EDUCATION:  Education details: discussed PT POC, answered questions regarding anatomy, advised pt to wear  supportive shoes at all times for now; issued HEP Person educated: Patient Education method: Explanation, Demonstration, and Handouts Education comprehension: verbalized understanding     HOME EXERCISE PROGRAM: Access Code: [email or web address] URL: [email or web address] Date: 10/10/2021 Prepared by: America Brown   Exercises - Arch Lifting  - 2 x daily - 7 x weekly - 1 sets - 10 reps - Seated Anterior Tibialis Stretch  - 2 x daily - 7 x weekly - 1 sets - 2-3 reps - Single Leg Stance  - 2 x daily - 7 x weekly - 1 sets - 2 reps - 30 sec hold - Towel Scrunches  - 2 x daily - 7 x weekly - 1 sets - 10 reps - Seated Plantar Fascia Stretch  - 1 x daily - 7 x weekly - 1 sets - 2 reps - 30 sec hold   ASSESSMENT:   CLINICAL IMPRESSION: Pt responded well to progress with ankle strengthening exercises today. She will continue to benefit from skilled PT to address her primary impairments and return to her prior level of function with less limitation.     OBJECTIVE IMPAIRMENTS Abnormal gait, decreased balance, decreased coordination, decreased endurance, difficulty walking, and hypermobility .    ACTIVITY LIMITATIONS locomotion level   PARTICIPATION LIMITATIONS:  walking   PERSONAL FACTORS 1 comorbidity: weight  are also affecting patient's functional outcome.        GOALS: Goals reviewed with patient? Yes     LONG TERM GOALS: Target date:  11/21/21     Pt will be indep with HEP to assist with pain management  Baseline: given at eval 11/07/2021: Pt reports adherence to her HEP Goal status: ACHIEVED   2.  Pt will be able to walk 1 hour with </=3/10 pain Baseline: 8/10 Goal status: INITIAL   3.  Pt will be able to SLS >20 R LE without UE assist to assist with improved gait with reduced pain Baseline: unable Goal status: INITIAL   4.  Pt will demo improved foto score to 72% function Baseline: 59% function Goal status: INITIAL   5.  Pt will have improved resting  calcaneal position to </=3 degrees to assist with more normalized gait Baseline: 6 degrees Goal status: INITIAL         PLAN: PT FREQUENCY: 1-2x/week   PT DURATION: 6 weeks   PLANNED INTERVENTIONS: Therapeutic exercises, Therapeutic activity, Neuromuscular re-education, Balance training, Gait training, Patient/Family education, Joint mobilization, Dry Needling, Cryotherapy, Moist heat, Taping, Ultrasound, Ionotophoresis 4mg /ml Dexamethasone, Manual therapy, and Re-evaluation   PLAN FOR NEXT SESSION: progress metatarsal strengthening, stretch invertors R LE, reeval, taping as prior session if pt desires    , PT 11/17/2021, 10:46 AM  [facility] [address] [place], [place], [place] Phone: [phone]   Fax:  [phone]

## 2021-11-20 ENCOUNTER — Ambulatory Visit: Payer: BC Managed Care – PPO

## 2021-11-20 DIAGNOSIS — R2689 Other abnormalities of gait and mobility: Secondary | ICD-10-CM | POA: Diagnosis not present

## 2021-11-20 DIAGNOSIS — R278 Other lack of coordination: Secondary | ICD-10-CM

## 2021-11-20 NOTE — Therapy (Signed)
[place], [place], [address] Phone: [phone]   Fax:  [phone]  Patient Details  Name: Lynn Reeves MRN: [id number] Date of Birth: Oct 27, 1975 Referring Provider:  Antony Blackbird, MD  Encounter Date: 11/20/2021   OUTPATIENT PHYSICAL THERAPY TREATMENT NOTE/RE-ASSESSMENT   Patient Name: Lynn Reeves MRN: [id number] DOB:1975/12/26, 46 y.o., female Today's Date: 11/20/2021  PCP: Donald Prose, MD REFERRING PROVIDER: Tyson Dense, DPM  END OF SESSION:   PT End of Session - 11/20/21 0833     Visit Number 7    Number of Visits 12    Date for PT Re-Evaluation 11/21/21    Authorization Type BC[employer]    Progress Note Due on Visit 10    PT Start Time [phone]    PT Stop Time 0915    PT Time Calculation (min) 40 min    Activity Tolerance Patient tolerated treatment well;No increased pain    Behavior During Therapy [facility] for tasks assessed/performed                  Past Medical History:  Diagnosis Date   Anxiety 04/16/2021   Arthritis    Asthma    Gastroesophageal reflux disease without esophagitis 04/16/2021   Hypertension    History reviewed. No pertinent surgical history. Patient Active Problem List   Diagnosis Date Noted   Anxiety 04/16/2021   Gastroesophageal reflux disease without esophagitis 04/16/2021   Morbid obesity ([place]) 04/16/2021   Osteoarthritis of knee 04/16/2021   Diverticulitis 01/29/2012   Enteritis 04/25/2011    Class: Acute   Hypertension 04/25/2011   Leucocytosis 04/25/2011    REFERRING DIAG: M76.61 (ICD-10-CM) - Achilles tendinitis of right lower extremity  THERAPY DIAG: Other abnormalities of gait and mobility   Other lack of coordination   Rationale for Evaluation and Treatment Rehabilitation  PERTINENT HISTORY: Encounter Date:  07/14/2021   Signed                 Lynn Reeves presents today for follow-up of her insertional Achilles tendinitis of her right  heel.  She states that really is the same she took the [place] she has tried the cam walker she is done everything that we have asked her to do to no avail.  She states that is still only about 50% improved from her initial evaluation.  Objective: Vital signs are stable alert and oriented x3.  Pulses are palpable.  She still has sharp piercing pain on palpation of the posterior aspect of the Achilles at its insertion site medially and laterally as well as centrally.  She has no proximal pain on palpation of the tendon itself.  Assessment: Most likely an insertional intrasubstance tear of the Achilles at its insertion however the only way to know this is MRI.  Plan: Requesting MRI of the right Achilles tendinous insertion highly suspect a tear secondary to failure of conservative therapy to alleviate her symptomatology.  She will notify us if she does not hear from the scheduling cen          PRECAUTIONS: None  SUBJECTIVE: Average pain is 3/10 but has decreased to 1/10 at best.  Still noted symptoms when arising from sitting and with stair climbing. PAIN:  Are you having pain? 3-4/10, ankle/achilles insertion, worse with steps(concentric)   OBJECTIVE: (objective measures completed at initial evaluation unless otherwise dated)     OBJECTIVE:    DIAGNOSTIC FINDINGS:  08/01/21 MRI: 1. No acute injury of the Achilles tendon. Mild enthesopathic  changes of the Achilles tendon insertion. 2.  No acute osseous injury of the right ankle. Past treatments: Medrol Dosepak, Cam walker, Meloxicam, HEP   PATIENT SURVEYS:  FOTO 59% function   COGNITION:           Overall cognitive status: Within functional limits for tasks assessed                          SENSATION: WFL   EDEMA: none     POSTURE:  overweight; limited arm swing   PALPATION: Hypermobile metatarsals R; toe flexion/ext WNL and = bil; calf/achilles not tender to palpation, plantar fascia tighter R but not tender; PROM  R ankle = compared to L WNL   LOWER EXTREMITY MMT:   Active ROM Right eval Left eval  Hip flexion 5/5 5/5  Hip extension 3+/5 3+/5  Hip abduction      Hip adduction      Hip internal rotation      Hip external rotation      Knee flexion 5/5 5/5  Knee extension 5/5 5/5  Ankle dorsiflexion 5/5 5/5  Ankle plantarflexion 5/5 5/5  Ankle inversion 5/5 5/5  Ankle eversion 5/5 5/5   (Blank rows = not tested) Pt also able to do standing calf/toe raises without difficulty   LOWER EXTREMITY ROM: all measured supine              L calcaneal PROM inversion 10 and eversion 11; R inversion 18 and eversion 10 (measured in degrees)            Resting calcaneal position L 2 degrees and R 6 degrees Knee AROM WNL AND FULL   BALANCE: Pt unable to SLS without UE A with noticeable ankle instability     GAIT: Distance walked: IN GYM Assistive device utilized: None Level of assistance: Complete Independence Comments: coxa vara, genu valga, flat footed bil in sneakers with ankle instability noted; without shoes, L calcaneal eversion       TODAY'S TREATMENT: [facility] Adult PT Treatment:                                                DATE: 11/20/21 Therapeutic Exercise: NuStep LEs only x 6 min level 4 Airex R eccentric heel raise 3 x 20 at counter with heel off Re-assessment of goals    [facility] Adult PT Treatment:                                                DATE: 11/14/21 Therapeutic Exercise: NuStep LEs only x 5 min level 6 while discussing pain Airex R eccentric heel raise 3 x 20 at counter with heel off Airex R SLS with bil UE abdct/addct x 20 Airex squats x 20 6 inch step with Airex pad on top performing R knee bends 2x20 Slant board stretch 2x30 sec Airex pad NBOS with bil GTB row x 20 Airex pad iso minisquat with GTB bil bicep curls x 20 Static lunges R x 20 at counter Counter R iso toe heel raise with hip hinge over counter with glute set x 20 Dynamic lunges with concentration on  foot positioning and balancing with each transition Slant  board 2x30 sec Supine R calf stretch with sheet in neutral and then pronating foot to stretch R invertors 2x30 sec each direction Manual Therapy: R foot invertor stretch, plantar fascia stretch, calcaneal mobs grade 2   [facility] Adult PT Treatment:                                                DATE: 11/07/2021 Therapeutic Exercise: Kickstand stance on Airex pad with lead foot off front edge of pad with 3# cable Pallof press 2x10 with 5-sec holds on Rt each side Eccentric heel raise on edge of Airex pad 3x12 Medial ankle slides with towel with 5# dumbbell 2x5 on Rt Lateral ankle slides with towel with 5# dumbbell 2x5 on Rt Manual Therapy: Short-to-long longitudinal stroke IASTM to Rt Achilles Tendon Rock tape calcaneal hammock strap with lateral I-band across distal Achilles tendon on Rt for passive support Neuromuscular re-ed: N/A Therapeutic Activity: N/A Modalities: N/A Self Care: N/A   [facility] Adult PT Treatment:                                                DATE: 10/31/21 Therapeutic Exercise: NuStep LEs only level 5 with PT discussing differences in between strengthening and stretching workouts and the focus Slant board stretches bil 3x30 sec R single limb arch raising/flattening exercise x 20 at the counter Standing R toe ext 1-5 x 20 Standing R ankle eversion x 20 Supine R PF/eversion stretch combo to stretch R invertors x20 R RTB eversion x 20 R toe curl/splay AROM x 20 Supine R gastroc stretch 3x30 sec with towel Manual Therapy: R talocrural joint stretch R ankle invertor stretch Soft tissue work R invertors Manual R calcaneal lateral mobs and static stretch R achilles and invertor soft tissue work Pt states that she feels stiff when she is sitting at work and gets up; PT advised to do some seated calf/toe raises 5-10 to help to loosen her up prior and to increase her frequency with her HEP from 2-3x/day as able.     [facility] Adult PT Treatment:                                                DATE: 10/21/2021 Therapeutic Exercise: Long-sitting plantar fascia stretch with sheet x2 min on Rt Kickstand stance on Airex pad with Rt foot forward on edge of pad with 5# kettlebell lateral hand-offs 2x20 Standing marching with slow return on Airex pad 2x20 Seated Rt ankle inversion with towel and 5# kettlebell x5 Seated Rt ankle eversion with towel and 5# kettlebell x5 Eccentric heel raises on edge of Airex pad 3x20 Standing gastroc slantboard stretch x56mn Manual Therapy: N/A Neuromuscular re-ed: N/A Therapeutic Activity: N/A Modalities: N/A Self Care: N/A     PATIENT EDUCATION:  Education details: discussed PT POC, answered questions regarding anatomy, advised pt to wear supportive shoes at all times for now; issued HEP Person educated: Patient Education method: Explanation, Demonstration, and Handouts Education comprehension: verbalized understanding     HOME EXERCISE PROGRAM: Access Code: [email or web address] URL: [email or web address] Date: 10/10/2021 Prepared by: NAmerica Brown  Exercises - Arch Lifting  - 2 x daily - 7 x weekly - 1 sets - 10 reps - Seated Anterior Tibialis Stretch  - 2 x daily - 7 x weekly - 1 sets - 2-3 reps - Single Leg Stance  - 2 x daily - 7 x weekly - 1 sets - 2 reps - 30 sec hold - Towel Scrunches  - 2 x daily - 7 x weekly - 1 sets - 10 reps - Seated Plantar Fascia Stretch  - 1 x daily - 7 x weekly - 1 sets - 2 reps - 30 sec hold   ASSESSMENT:   CLINICAL IMPRESSION: Continued ankle stretch, strength and functional training.  Today' focus was maintaining proper form and pacing on all tasks.  Re-assessed progress towards goals.  Gait assessment shows R medial heel whip and additional stretching added to HEP to include plantar fascia stretch   OBJECTIVE IMPAIRMENTS Abnormal gait, decreased balance, decreased coordination, decreased endurance, difficulty walking,  and hypermobility .    ACTIVITY LIMITATIONS locomotion level   PARTICIPATION LIMITATIONS:  walking   PERSONAL FACTORS 1 comorbidity: weight  are also affecting patient's functional outcome.        GOALS: Goals reviewed with patient? Yes     LONG TERM GOALS: Target date:  01/21/22     Pt will be indep with HEP to assist with pain management                  Baseline: given at eval 11/07/2021: Pt reports adherence to her HEP Goal status: ACHIEVED   2.  Pt will be able to walk 1 hour with </=3/10 pain Baseline: 8/10; 11/20/21 4/10 pain when walking 20 min on tradmill  Goal status: Ongoing   3.  Pt will be able to SLS >20 R LE without UE assist to assist with improved gait with reduced pain Baseline: unable; 11/20/21 30s hold w/o pain Goal status: Met   4.  Pt will demo improved foto score to 72% function Baseline: 59% function; 11/20/21 57% Goal status: Ongoing   5.  Pt will have improved resting calcaneal position to </=3 degrees to assist with more normalized gait Baseline: 6 degrees Goal status: Ongoing        PLAN: PT FREQUENCY: 1x/week   PT DURATION: 4 weeks   PLANNED INTERVENTIONS: Therapeutic exercises, Therapeutic activity, Neuromuscular re-education, Balance training, Gait training, Patient/Family education, Joint mobilization, Dry Needling, Cryotherapy, Moist heat, Taping, Ultrasound, Ionotophoresis 95m/ml Dexamethasone, Manual therapy, and Re-evaluation   PLAN FOR NEXT SESSION: progress metatarsal strengthening, stretch invertors R LE, reeval, taping as prior session if pt desires    JLanice Shirts PT 11/20/2021, 10:45 AM  [id number] [address]Phone: [phone]  Fax:  [phone]

## 2021-11-28 ENCOUNTER — Ambulatory Visit: Payer: BC Managed Care – PPO

## 2021-11-28 DIAGNOSIS — R278 Other lack of coordination: Secondary | ICD-10-CM

## 2021-11-28 DIAGNOSIS — R2689 Other abnormalities of gait and mobility: Secondary | ICD-10-CM | POA: Diagnosis not present

## 2021-11-28 NOTE — Therapy (Signed)
[name], [place], [address] Phone: [phone]   Fax:  [phone]  Patient Details  Name: Lynn Reeves MRN: [id number] Date of Birth: 09-08-1975 Referring Provider:  Antony Blackbird, MD  Encounter Date: 11/28/2021   OUTPATIENT PHYSICAL THERAPY TREATMENT NOTE/RE-ASSESSMENT   Patient Name: Lynn Reeves MRN: [id number] DOB:01/06/1976, 46 y.o., female Today's Date: 11/28/2021  PCP: Lynn Prose, MD REFERRING PROVIDER: Tyson Reeves, DPM  END OF SESSION:   PT End of Session - 11/28/21 1028     Visit Number 8    Number of Visits 12    Date for PT Re-Evaluation 11/21/21    Authorization Type [employer]    Progress Note Due on Visit 10    PT Start Time [phone]    PT Stop Time 1026    PT Time Calculation (min) 38 min    Activity Tolerance Patient tolerated treatment well;No increased pain    Behavior During Therapy [facility] for tasks assessed/performed                   Past Medical History:  Diagnosis Date   Anxiety 04/16/2021   Arthritis    Asthma    Gastroesophageal reflux disease without esophagitis 04/16/2021   Hypertension    History reviewed. No pertinent surgical history. Patient Active Problem List   Diagnosis Date Noted   Anxiety 04/16/2021   Gastroesophageal reflux disease without esophagitis 04/16/2021   Morbid obesity ([place]) 04/16/2021   Osteoarthritis of knee 04/16/2021   Diverticulitis 01/29/2012   Enteritis 04/25/2011    Class: Acute   Hypertension 04/25/2011   Leucocytosis 04/25/2011    REFERRING DIAG: M76.61 (ICD-10-CM) - Achilles tendinitis of right lower extremity  THERAPY DIAG: Other abnormalities of gait and mobility   Other lack of coordination   Rationale for Evaluation and Treatment Rehabilitation  PERTINENT HISTORY: Encounter Date:  07/14/2021   Signed                 Lynn Reeves presents today for follow-up of her insertional Achilles tendinitis of her right  heel.  She states that really is the same she took the [place] she has tried the cam walker she is done everything that we have asked her to do to no avail.  She states that is still only about 50% improved from her initial evaluation.  Objective: Vital signs are stable alert and oriented x3.  Pulses are palpable.  She still has sharp piercing pain on palpation of the posterior aspect of the Achilles at its insertion site medially and laterally as well as centrally.  She has no proximal pain on palpation of the tendon itself.  Assessment: Most likely an insertional intrasubstance tear of the Achilles at its insertion however the only way to know this is MRI.  Plan: Requesting MRI of the right Achilles tendinous insertion highly suspect a tear secondary to failure of conservative therapy to alleviate her symptomatology.  She will notify us if she does not hear from the scheduling cen          PRECAUTIONS: None  SUBJECTIVE:  11/28/2021: Patient states that overall her pain is still about the same but did notice that she went to [place] last Saturday and states that she was pleasantly surprised that she didn't have much pain.   PAIN:  Are you having pain? 3-4/10, ankle/achilles insertion, worse with steps(concentric)   OBJECTIVE: (objective measures completed at initial evaluation unless otherwise dated)     OBJECTIVE:  DIAGNOSTIC FINDINGS:  08/01/21 MRI: 1. No acute injury of the Achilles tendon. Mild enthesopathic changes of the Achilles tendon insertion. 2.  No acute osseous injury of the right ankle. Past treatments: Medrol Dosepak, Cam walker, Meloxicam, HEP   PATIENT SURVEYS:  FOTO 59% function   COGNITION:           Overall cognitive status: Within functional limits for tasks assessed                          SENSATION: WFL   EDEMA: none     POSTURE:  overweight; limited arm swing   PALPATION: Hypermobile metatarsals R; toe flexion/ext WNL and = bil;  calf/achilles not tender to palpation, plantar fascia tighter R but not tender; PROM R ankle = compared to L WNL   LOWER EXTREMITY MMT:   Active ROM Right eval Left eval  Hip flexion 5/5 5/5  Hip extension 3+/5 3+/5  Hip abduction      Hip adduction      Hip internal rotation      Hip external rotation      Knee flexion 5/5 5/5  Knee extension 5/5 5/5  Ankle dorsiflexion 5/5 5/5  Ankle plantarflexion 5/5 5/5  Ankle inversion 5/5 5/5  Ankle eversion 5/5 5/5   (Blank rows = not tested) Pt also able to do standing calf/toe raises without difficulty   LOWER EXTREMITY ROM: all measured supine              L calcaneal PROM inversion 10 and eversion 11; R inversion 18 and eversion 10 (measured in degrees)            Resting calcaneal position L 2 degrees and R 6 degrees Knee AROM WNL AND FULL   BALANCE: Pt unable to SLS without UE A with noticeable ankle instability     GAIT: Distance walked: IN GYM Assistive device utilized: None Level of assistance: Complete Independence Comments: coxa vara, genu valga, flat footed bil in sneakers with ankle instability noted; without shoes, L calcaneal eversion       TODAY'S TREATMENT: [facility] Adult PT Treatment:                                                 Date: 11/28/2021 Exercises Soft tissue mobilizations right achilles, plantar fascia, gastroc. Grade II-III Talocrural joint mobilizations 3x40 seconds airex heel hangs 3x30 seconds bosu alternating heel raises 2x20 reps lunges at counter. Last rep 5 second heel raise hold 2x1 minute slant board stretch Forward and lateral towel sliders x2 minutes each Nu step 5 minutes  DATE: 11/20/21 Therapeutic Exercise: NuStep LEs only x 6 min level 4 Airex R eccentric heel raise 3 x 20 at counter with heel off Re-assessment of goals    [facility] Adult PT Treatment:                                                DATE: 11/14/21 Therapeutic Exercise: NuStep LEs only x 5 min level 6 while  discussing pain Airex R eccentric heel raise 3 x 20 at counter with heel off Airex R SLS with bil UE abdct/addct x 20 Airex squats x 20 6   inch step with Airex pad on top performing R knee bends 2x20 Slant board stretch 2x30 sec Airex pad NBOS with bil GTB row x 20 Airex pad iso minisquat with GTB bil bicep curls x 20 Static lunges R x 20 at counter Counter R iso toe heel raise with hip hinge over counter with glute set x 20 Dynamic lunges with concentration on foot positioning and balancing with each transition Slant board 2x30 sec Supine R calf stretch with sheet in neutral and then pronating foot to stretch R invertors 2x30 sec each direction Manual Therapy: R foot invertor stretch, plantar fascia stretch, calcaneal mobs grade 2   [facility] Adult PT Treatment:                                                DATE: 11/07/2021 Therapeutic Exercise: Kickstand stance on Airex pad with lead foot off front edge of pad with 3# cable Pallof press 2x10 with 5-sec holds on Rt each side Eccentric heel raise on edge of Airex pad 3x12 Medial ankle slides with towel with 5# dumbbell 2x5 on Rt Lateral ankle slides with towel with 5# dumbbell 2x5 on Rt Manual Therapy: Short-to-long longitudinal stroke IASTM to Rt Achilles Tendon Rock tape calcaneal hammock strap with lateral I-band across distal Achilles tendon on Rt for passive support Neuromuscular re-ed: N/A Therapeutic Activity: N/A Modalities: N/A Self Care: N/A   [facility] Adult PT Treatment:                                                DATE: 10/31/21 Therapeutic Exercise: NuStep LEs only level 5 with PT discussing differences in between strengthening and stretching workouts and the focus Slant board stretches bil 3x30 sec R single limb arch raising/flattening exercise x 20 at the counter Standing R toe ext 1-5 x 20 Standing R ankle eversion x 20 Supine R PF/eversion stretch combo to stretch R invertors x20 R RTB eversion x 20 R toe curl/splay  AROM x 20 Supine R gastroc stretch 3x30 sec with towel Manual Therapy: R talocrural joint stretch R ankle invertor stretch Soft tissue work R invertors Manual R calcaneal lateral mobs and static stretch R achilles and invertor soft tissue work Pt states that she feels stiff when she is sitting at work and gets up; PT advised to do some seated calf/toe raises 5-10 to help to loosen her up prior and to increase her frequency with her HEP from 2-3x/day as able.    [facility] Adult PT Treatment:                                                DATE: 10/21/2021 Therapeutic Exercise: Long-sitting plantar fascia stretch with sheet x2 min on Rt Kickstand stance on Airex pad with Rt foot forward on edge of pad with 5# kettlebell lateral hand-offs 2x20 Standing marching with slow return on Airex pad 2x20 Seated Rt ankle inversion with towel and 5# kettlebell x5 Seated Rt ankle eversion with towel and 5# kettlebell x5 Eccentric heel raises on edge of Airex pad 3x20 Standing gastroc slantboard stretch x2min Manual   Therapy: N/A Neuromuscular re-ed: N/A Therapeutic Activity: N/A Modalities: N/A Self Care: N/A     PATIENT EDUCATION:  Education details: Discussed continuing with HEP Person educated: Patient Education method: Explanation, Demonstration, and Handouts Education comprehension: verbalized understanding     HOME EXERCISE PROGRAM: Access Code: [email or web address] URL: [email or web address] Date: 10/10/2021 Prepared by: America Brown   Exercises - Arch Lifting  - 2 x daily - 7 x weekly - 1 sets - 10 reps - Seated Anterior Tibialis Stretch  - 2 x daily - 7 x weekly - 1 sets - 2-3 reps - Single Leg Stance  - 2 x daily - 7 x weekly - 1 sets - 2 reps - 30 sec hold - Towel Scrunches  - 2 x daily - 7 x weekly - 1 sets - 10 reps - Seated Plantar Fascia Stretch  - 1 x daily - 7 x weekly - 1 sets - 2 reps - 30 sec hold   ASSESSMENT:   CLINICAL IMPRESSION: Patient presents to therapy  with min pain at rest. Presents with continued soft tissue restrictions and point tenderness to medial gastroc, platar fascia, and distal achilles. Reported mild relief of symptoms following soft tissue mobilizations and grade II-III Talocrural joint mobilizations. Added airex heel hangs and bosu alternating heel raises for improved eccentric control and intrinsic foot control. Continues to have difficulty with standing greater than 30 minutes and prolonged walking. Patient continues to benefit from skilled therapy services to address deficits.    OBJECTIVE IMPAIRMENTS Abnormal gait, decreased balance, decreased coordination, decreased endurance, difficulty walking, and hypermobility .    ACTIVITY LIMITATIONS locomotion level   PARTICIPATION LIMITATIONS:  walking   PERSONAL FACTORS 1 comorbidity: weight  are also affecting patient's functional outcome.        GOALS: Goals reviewed with patient? Yes     LONG TERM GOALS: Target date:  01/21/22     Pt will be indep with HEP to assist with pain management                  Baseline: given at eval 11/07/2021: Pt reports adherence to her HEP Goal status: ACHIEVED   2.  Pt will be able to walk 1 hour with </=3/10 pain Baseline: 8/10; 11/20/21 4/10 pain when walking 20 min on tradmill  Goal status: Ongoing   3.  Pt will be able to SLS >20 R LE without UE assist to assist with improved gait with reduced pain Baseline: unable; 11/20/21 30s hold w/o pain Goal status: Met   4.  Pt will demo improved foto score to 72% function Baseline: 59% function; 11/20/21 57% Goal status: Ongoing   5.  Pt will have improved resting calcaneal position to </=3 degrees to assist with more normalized gait Baseline: 6 degrees Goal status: Ongoing        PLAN: PT FREQUENCY: 1x/week   PT DURATION: 4 weeks   PLANNED INTERVENTIONS: Therapeutic exercises, Therapeutic activity, Neuromuscular re-education, Balance training, Gait training, Patient/Family education,  Joint mobilization, Dry Needling, Cryotherapy, Moist heat, Taping, Ultrasound, Ionotophoresis 40m/ml Dexamethasone, Manual therapy, and Re-evaluation   PLAN FOR NEXT SESSION: progress metatarsal strengthening, stretch invertors R LE, reeval, taping as prior session if pt desires    AAwilda BillDiy, PT, DPT 11/28/2021, 10:29 AM  [facility] [address]Phone: [phone]  Fax:  [phone]

## 2021-12-05 ENCOUNTER — Ambulatory Visit: Payer: BC Managed Care – PPO | Attending: Podiatry

## 2021-12-05 DIAGNOSIS — R2689 Other abnormalities of gait and mobility: Secondary | ICD-10-CM | POA: Insufficient documentation

## 2021-12-05 DIAGNOSIS — R278 Other lack of coordination: Secondary | ICD-10-CM | POA: Insufficient documentation

## 2021-12-05 DIAGNOSIS — R262 Difficulty in walking, not elsewhere classified: Secondary | ICD-10-CM | POA: Diagnosis not present

## 2021-12-05 DIAGNOSIS — M79671 Pain in right foot: Secondary | ICD-10-CM | POA: Diagnosis not present

## 2021-12-05 NOTE — Therapy (Signed)
[place], [place], [address] Phone: [phone]   Fax:  [phone]  Patient Details  Name: Lynn Reeves MRN: [id number] Date of Birth: 09/03/1975 Referring Provider:  Antony Blackbird, MD  Encounter Date: 12/05/2021   OUTPATIENT PHYSICAL THERAPY TREATMENT NOTE/RE-ASSESSMENT   Patient Name: Lynn Reeves MRN: [id number] DOB:1975/06/14, 46 y.o., female Today's Date: 12/05/2021  PCP: Donald Prose, MD REFERRING PROVIDER: Tyson Dense, DPM  END OF SESSION:   PT End of Session - 12/05/21 1035     Visit Number 9    Number of Visits 12    Date for PT Re-Evaluation 11/21/21    Authorization Type [employer]    Progress Note Due on Visit 10    PT Start Time 0950    PT Stop Time 1030    PT Time Calculation (min) 40 min    Activity Tolerance Patient tolerated treatment well;No increased pain    Behavior During Therapy [facility] for tasks assessed/performed                    Past Medical History:  Diagnosis Date   Anxiety 04/16/2021   Arthritis    Asthma    Gastroesophageal reflux disease without esophagitis 04/16/2021   Hypertension    History reviewed. No pertinent surgical history. Patient Active Problem List   Diagnosis Date Noted   Anxiety 04/16/2021   Gastroesophageal reflux disease without esophagitis 04/16/2021   Morbid obesity ([place]) 04/16/2021   Osteoarthritis of knee 04/16/2021   Diverticulitis 01/29/2012   Enteritis 04/25/2011    Class: Acute   Hypertension 04/25/2011   Leucocytosis 04/25/2011    REFERRING DIAG: M76.61 (ICD-10-CM) - Achilles tendinitis of right lower extremity  THERAPY DIAG: Other abnormalities of gait and mobility   Other lack of coordination   Rationale for Evaluation and Treatment Rehabilitation  PERTINENT HISTORY: Encounter Date:  07/14/2021   Signed                 Steele presents today for follow-up of her insertional Achilles tendinitis of her right  heel.  She states that really is the same she took the [place] she has tried the cam walker she is done everything that we have asked her to do to no avail.  She states that is still only about 50% improved from her initial evaluation.  Objective: Vital signs are stable alert and oriented x3.  Pulses are palpable.  She still has sharp piercing pain on palpation of the posterior aspect of the Achilles at its insertion site medially and laterally as well as centrally.  She has no proximal pain on palpation of the tendon itself.  Assessment: Most likely an insertional intrasubstance tear of the Achilles at its insertion however the only way to know this is MRI.  Plan: Requesting MRI of the right Achilles tendinous insertion highly suspect a tear secondary to failure of conservative therapy to alleviate her symptomatology.  She will notify us if she does not hear from the scheduling cen          PRECAUTIONS: None  SUBJECTIVE:  12/05/2021: Patient states that after last session she had some muscle soreness but overall felt pretty good. States that she did have sharp/stabbing pains that came randomly which she doesn't usually have  11/28/2021: Patient states that overall her pain is still about the same but did notice that she went to [place] last Saturday and states that she was pleasantly surprised that she didn't have much  pain.   PAIN:  Are you having pain? 3/10, ankle/achilles insertion, worse with steps(concentric)   OBJECTIVE: (objective measures completed at initial evaluation unless otherwise dated)     OBJECTIVE:    DIAGNOSTIC FINDINGS:  08/01/21 MRI: 1. No acute injury of the Achilles tendon. Mild enthesopathic changes of the Achilles tendon insertion. 2.  No acute osseous injury of the right ankle. Past treatments: Medrol Dosepak, Cam walker, Meloxicam, HEP   PATIENT SURVEYS:  FOTO 59% function   COGNITION:           Overall cognitive status: Within functional limits  for tasks assessed                          SENSATION: WFL   EDEMA: none     POSTURE:  overweight; limited arm swing   PALPATION: Hypermobile metatarsals R; toe flexion/ext WNL and = bil; calf/achilles not tender to palpation, plantar fascia tighter R but not tender; PROM R ankle = compared to L WNL   LOWER EXTREMITY MMT:   Active ROM Right eval Left eval  Hip flexion 5/5 5/5  Hip extension 3+/5 3+/5  Hip abduction      Hip adduction      Hip internal rotation      Hip external rotation      Knee flexion 5/5 5/5  Knee extension 5/5 5/5  Ankle dorsiflexion 5/5 5/5  Ankle plantarflexion 5/5 5/5  Ankle inversion 5/5 5/5  Ankle eversion 5/5 5/5   (Blank rows = not tested) Pt also able to do standing calf/toe raises without difficulty   LOWER EXTREMITY ROM: all measured supine              L calcaneal PROM inversion 10 and eversion 11; R inversion 18 and eversion 10 (measured in degrees)            Resting calcaneal position L 2 degrees and R 6 degrees Knee AROM WNL AND FULL   BALANCE: Pt unable to SLS without UE A with noticeable ankle instability     GAIT: Distance walked: IN GYM Assistive device utilized: None Level of assistance: Complete Independence Comments: coxa vara, genu valga, flat footed bil in sneakers with ankle instability noted; without shoes, L calcaneal eversion       TODAY'S TREATMENT: [facility] Adult PT Treatment:                                                 12/05/2021 Exercises NU step x4 minutes level 7 Heel hangs and lateral stepping airex balance beam 3x30 seconds for eccentric control Post tib heel raises with tennis ball; eccentric lower Retro gait with cable column. Emphasis on slow transition heel/toe. 2x2 minutes at 17lbs Trampoline lunges with trampoline at slight incline with focus on dorsiflexion Manual therapy Soft tissue mobilizations to gastroc, posterior tib/anterior tib, plantar fascia Grade II-III TCJ mobilizations Date:  11/28/2021 Exercises Soft tissue mobilizations right achilles, plantar fascia, gastroc. Grade II-III Talocrural joint mobilizations 3x40 seconds airex heel hangs 3x30 seconds bosu alternating heel raises 2x20 reps lunges at counter. Last rep 5 second heel raise hold 2x1 minute slant board stretch Forward and lateral towel sliders x2 minutes each Nu step 5 minutes  DATE: 11/20/21 Therapeutic Exercise: NuStep LEs only x 6 min level 4 Airex R eccentric heel raise 3  x 20 at counter with heel off Re-assessment of goals    [facility] Adult PT Treatment:                                                DATE: 11/14/21 Therapeutic Exercise: NuStep LEs only x 5 min level 6 while discussing pain Airex R eccentric heel raise 3 x 20 at counter with heel off Airex R SLS with bil UE abdct/addct x 20 Airex squats x 20 6 inch step with Airex pad on top performing R knee bends 2x20 Slant board stretch 2x30 sec Airex pad NBOS with bil GTB row x 20 Airex pad iso minisquat with GTB bil bicep curls x 20 Static lunges R x 20 at counter Counter R iso toe heel raise with hip hinge over counter with glute set x 20 Dynamic lunges with concentration on foot positioning and balancing with each transition Slant board 2x30 sec Supine R calf stretch with sheet in neutral and then pronating foot to stretch R invertors 2x30 sec each direction Manual Therapy: R foot invertor stretch, plantar fascia stretch, calcaneal mobs grade 2   [facility] Adult PT Treatment:                                                DATE: 11/07/2021 Therapeutic Exercise: Kickstand stance on Airex pad with lead foot off front edge of pad with 3# cable Pallof press 2x10 with 5-sec holds on Rt each side Eccentric heel raise on edge of Airex pad 3x12 Medial ankle slides with towel with 5# dumbbell 2x5 on Rt Lateral ankle slides with towel with 5# dumbbell 2x5 on Rt Manual Therapy: Short-to-long longitudinal stroke IASTM to Rt Achilles Tendon Rock tape  calcaneal hammock strap with lateral I-band across distal Achilles tendon on Rt for passive support Neuromuscular re-ed: N/A Therapeutic Activity: N/A Modalities: N/A Self Care: N/A   [facility] Adult PT Treatment:                                                DATE: 10/31/21 Therapeutic Exercise: NuStep LEs only level 5 with PT discussing differences in between strengthening and stretching workouts and the focus Slant board stretches bil 3x30 sec R single limb arch raising/flattening exercise x 20 at the counter Standing R toe ext 1-5 x 20 Standing R ankle eversion x 20 Supine R PF/eversion stretch combo to stretch R invertors x20 R RTB eversion x 20 R toe curl/splay AROM x 20 Supine R gastroc stretch 3x30 sec with towel Manual Therapy: R talocrural joint stretch R ankle invertor stretch Soft tissue work R invertors Manual R calcaneal lateral mobs and static stretch R achilles and invertor soft tissue work Pt states that she feels stiff when she is sitting at work and gets up; PT advised to do some seated calf/toe raises 5-10 to help to loosen her up prior and to increase her frequency with her HEP from 2-3x/day as able.    [facility] Adult PT Treatment:  DATE: 10/21/2021 Therapeutic Exercise: Long-sitting plantar fascia stretch with sheet x2 min on Rt Kickstand stance on Airex pad with Rt foot forward on edge of pad with 5# kettlebell lateral hand-offs 2x20 Standing marching with slow return on Airex pad 2x20 Seated Rt ankle inversion with towel and 5# kettlebell x5 Seated Rt ankle eversion with towel and 5# kettlebell x5 Eccentric heel raises on edge of Airex pad 3x20 Standing gastroc slantboard stretch x58mn Manual Therapy: N/A Neuromuscular re-ed: N/A Therapeutic Activity: N/A Modalities: N/A Self Care: N/A     PATIENT EDUCATION:  Education details: Discussed continuing with HEP Person educated: Patient Education method:  Explanation, Demonstration, and Handouts Education comprehension: verbalized understanding     HOME EXERCISE PROGRAM: Access Code: [email or web address] URL: [email or web address] Date: 10/10/2021 Prepared by: NAmerica Brown  Exercises - Arch Lifting  - 2 x daily - 7 x weekly - 1 sets - 10 reps - Seated Anterior Tibialis Stretch  - 2 x daily - 7 x weekly - 1 sets - 2-3 reps - Single Leg Stance  - 2 x daily - 7 x weekly - 1 sets - 2 reps - 30 sec hold - Towel Scrunches  - 2 x daily - 7 x weekly - 1 sets - 10 reps - Seated Plantar Fascia Stretch  - 1 x daily - 7 x weekly - 1 sets - 2 reps - 30 sec hold   ASSESSMENT:   CLINICAL IMPRESSION: Patient presents to therapy with min pain at rest. Patient is able to tolerate increased eccentric activities including heel hang side steps on compliant beam and post tib heel raises without and shows good stability throughout. Continues to present with mod soft tissue restrictions of gastroc and plantar fascia. Patient with good response to manual therapy stating slight improvements in symptoms afterwards with less pain with walking. Patient continues to benefit from skilled therapy services to address deficits.    OBJECTIVE IMPAIRMENTS Abnormal gait, decreased balance, decreased coordination, decreased endurance, difficulty walking, and hypermobility .    ACTIVITY LIMITATIONS locomotion level   PARTICIPATION LIMITATIONS:  walking   PERSONAL FACTORS 1 comorbidity: weight  are also affecting patient's functional outcome.        GOALS: Goals reviewed with patient? Yes     LONG TERM GOALS: Target date:  01/21/22     Pt will be indep with HEP to assist with pain management                  Baseline: given at eval 11/07/2021: Pt reports adherence to her HEP Goal status: ACHIEVED   2.  Pt will be able to walk 1 hour with </=3/10 pain Baseline: 8/10; 11/20/21 4/10 pain when walking 20 min on tradmill  Goal status: Ongoing   3.  Pt will be able to  SLS >20 R LE without UE assist to assist with improved gait with reduced pain Baseline: unable; 11/20/21 30s hold w/o pain Goal status: Met   4.  Pt will demo improved foto score to 72% function Baseline: 59% function; 11/20/21 57% Goal status: Ongoing   5.  Pt will have improved resting calcaneal position to </=3 degrees to assist with more normalized gait Baseline: 6 degrees Goal status: Ongoing        PLAN: PT FREQUENCY: 1x/week   PT DURATION: 4 weeks   PLANNED INTERVENTIONS: Therapeutic exercises, Therapeutic activity, Neuromuscular re-education, Balance training, Gait training, Patient/Family education, Joint mobilization, Dry Needling, Cryotherapy, Moist heat, Taping, Ultrasound, Ionotophoresis 456mml Dexamethasone, Manual therapy,  and Re-evaluation   PLAN FOR NEXT SESSION: progress metatarsal strengthening, stretch invertors R LE, reeval, taping as prior session if pt desires    Awilda Bill Sheriece Jefcoat, PT, DPT 12/05/2021, 10:37 AM  [facility] [address] [place], [place], [address] Phone: [phone]   Fax:  [phone]

## 2021-12-12 ENCOUNTER — Ambulatory Visit: Payer: BC Managed Care – PPO

## 2021-12-12 DIAGNOSIS — M79671 Pain in right foot: Secondary | ICD-10-CM | POA: Diagnosis not present

## 2021-12-12 DIAGNOSIS — R262 Difficulty in walking, not elsewhere classified: Secondary | ICD-10-CM | POA: Diagnosis not present

## 2021-12-12 DIAGNOSIS — R278 Other lack of coordination: Secondary | ICD-10-CM | POA: Diagnosis not present

## 2021-12-12 DIAGNOSIS — R2689 Other abnormalities of gait and mobility: Secondary | ICD-10-CM | POA: Diagnosis not present

## 2021-12-12 NOTE — Therapy (Signed)
[place], [place], [address] Phone: [phone]   Fax:  [phone]  Patient Details  Name: Lynn Reeves MRN: [id number] Date of Birth: 02-28-1976 Referring Provider:  Antony Blackbird, MD  Encounter Date: 12/12/2021   OUTPATIENT PHYSICAL THERAPY TREATMENT NOTE/RE-ASSESSMENT  Progress Note Reporting Period 10/10/2021 to 12/12/2021  See note below for Objective Data and Assessment of Progress/Goals.       Patient Name: Lynn Reeves MRN: [id number] DOB:06-28-1975, 46 y.o., female Today's Date: 12/12/2021  PCP: Donald Prose, MD REFERRING PROVIDER: Tyson Dense, DPM  END OF SESSION:   PT End of Session - 12/12/21 1104     Visit Number 10    Number of Visits 12    Authorization Type BCBS    Progress Note Due on Visit 10    PT Start Time 0947    PT Stop Time 1031    PT Time Calculation (min) 44 min    Activity Tolerance Patient tolerated treatment well;No increased pain    Behavior During Therapy [facility] for tasks assessed/performed                     Past Medical History:  Diagnosis Date   Anxiety 04/16/2021   Arthritis    Asthma    Gastroesophageal reflux disease without esophagitis 04/16/2021   Hypertension    No past surgical history on file. Patient Active Problem List   Diagnosis Date Noted   Anxiety 04/16/2021   Gastroesophageal reflux disease without esophagitis 04/16/2021   Morbid obesity ([place]) 04/16/2021   Osteoarthritis of knee 04/16/2021   Diverticulitis 01/29/2012   Enteritis 04/25/2011    Class: Acute   Hypertension 04/25/2011   Leucocytosis 04/25/2011    REFERRING DIAG: M76.61 (ICD-10-CM) - Achilles tendinitis of right lower extremity  THERAPY DIAG: Other abnormalities of gait and mobility   Other lack of coordination   Rationale for Evaluation and Treatment Rehabilitation  PERTINENT HISTORY: Encounter Date:  07/14/2021   Signed                  [name] presents today for follow-up of her insertional Achilles tendinitis of her right heel.  She states that really is the same she took the [place] she has tried the cam walker she is done everything that we have asked her to do to no avail.  She states that is still only about 50% improved from her initial evaluation.  Objective: Vital signs are stable alert and oriented x3.  Pulses are palpable.  She still has sharp piercing pain on palpation of the posterior aspect of the Achilles at its insertion site medially and laterally as well as centrally.  She has no proximal pain on palpation of the tendon itself.  Assessment: Most likely an insertional intrasubstance tear of the Achilles at its insertion however the only way to know this is MRI.  Plan: Requesting MRI of the right Achilles tendinous insertion highly suspect a tear secondary to failure of conservative therapy to alleviate her symptomatology.  She will notify us if she does not hear from the scheduling cen          PRECAUTIONS: None  SUBJECTIVE:  12/12/2021: Patient states that since start of therapy she has felt like she has already been seeing progress. States that she really just feels that at this time her ankle gets a little stiff or achy after long periods of walking.   12/05/2021: Patient states that after last session she  had some muscle soreness but overall felt pretty good. States that she did have sharp/stabbing pains that came randomly which she doesn't usually have  11/28/2021: Patient states that overall her pain is still about the same but did notice that she went to [place] last Saturday and states that she was pleasantly surprised that she didn't have much pain.   PAIN:  Are you having pain? 3/10, ankle/achilles insertion, worse with steps(concentric)   OBJECTIVE: (objective measures completed at initial evaluation unless otherwise dated)     OBJECTIVE:    DIAGNOSTIC FINDINGS:  08/01/21 MRI: 1. No  acute injury of the Achilles tendon. Mild enthesopathic changes of the Achilles tendon insertion. 2.  No acute osseous injury of the right ankle. Past treatments: Medrol Dosepak, Cam walker, Meloxicam, HEP   PATIENT SURVEYS:  FOTO 59% function   COGNITION:           Overall cognitive status: Within functional limits for tasks assessed                          SENSATION: WFL   EDEMA: none     POSTURE:  overweight; limited arm swing   PALPATION: Hypermobile metatarsals R; toe flexion/ext WNL and = bil; calf/achilles not tender to palpation, plantar fascia tighter R but not tender; PROM R ankle = compared to L WNL   LOWER EXTREMITY MMT:   Active ROM Right eval Left eval  Hip flexion 5/5 5/5  Hip extension 3+/5 3+/5  Hip abduction      Hip adduction      Hip internal rotation      Hip external rotation      Knee flexion 5/5 5/5  Knee extension 5/5 5/5  Ankle dorsiflexion 5/5 5/5  Ankle plantarflexion 5/5 5/5  Ankle inversion 5/5 5/5  Ankle eversion 5/5 5/5   (Blank rows = not tested) Pt also able to do standing calf/toe raises without difficulty   LOWER EXTREMITY ROM: all measured supine              L calcaneal PROM inversion 10 and eversion 11; R inversion 18 and eversion 10 (measured in degrees)            Resting calcaneal position L 2 degrees and R 6 degrees Knee AROM WNL AND FULL   BALANCE: Pt unable to SLS without UE A with noticeable ankle instability     GAIT: Distance walked: IN GYM Assistive device utilized: None Level of assistance: Complete Independence Comments: coxa vara, genu valga, flat footed bil in sneakers with ankle instability noted; without shoes, L calcaneal eversion       TODAY'S TREATMENT: [facility] Adult PT Treatment:                                                 12/12/2021 Exercises 10 reps inversion towel sweeps with 2lbs 3x1 minute three way airex cone taps 3x1 minute bosu marching/calf stretch 2x10 reps 2 up/1 down heel  raises 2x1 minute retro cable walk at 17lbs 5 minutes NuStep level 8  12/05/2021 Exercises NU step x4 minutes level 7 Heel hangs and lateral stepping airex balance beam 3x30 seconds for eccentric control Post tib heel raises with tennis ball; eccentric lower Retro gait with cable column. Emphasis on slow transition heel/toe. 2x2 minutes at 17lbs Trampoline lunges  with trampoline at slight incline with focus on dorsiflexion Manual therapy Soft tissue mobilizations to gastroc, posterior tib/anterior tib, plantar fascia Grade II-III TCJ mobilizations Date: 11/28/2021 Exercises Soft tissue mobilizations right achilles, plantar fascia, gastroc. Grade II-III Talocrural joint mobilizations 3x40 seconds airex heel hangs 3x30 seconds bosu alternating heel raises 2x20 reps lunges at counter. Last rep 5 second heel raise hold 2x1 minute slant board stretch Forward and lateral towel sliders x2 minutes each Nu step 5 minutes  DATE: 11/20/21 Therapeutic Exercise: NuStep LEs only x 6 min level 4 Airex R eccentric heel raise 3 x 20 at counter with heel off Re-assessment of goals    [facility] Adult PT Treatment:                                                DATE: 11/14/21 Therapeutic Exercise: NuStep LEs only x 5 min level 6 while discussing pain Airex R eccentric heel raise 3 x 20 at counter with heel off Airex R SLS with bil UE abdct/addct x 20 Airex squats x 20 6 inch step with Airex pad on top performing R knee bends 2x20 Slant board stretch 2x30 sec Airex pad NBOS with bil GTB row x 20 Airex pad iso minisquat with GTB bil bicep curls x 20 Static lunges R x 20 at counter Counter R iso toe heel raise with hip hinge over counter with glute set x 20 Dynamic lunges with concentration on foot positioning and balancing with each transition Slant board 2x30 sec Supine R calf stretch with sheet in neutral and then pronating foot to stretch R invertors 2x30 sec each direction Manual Therapy: R foot  invertor stretch, plantar fascia stretch, calcaneal mobs grade 2   [facility] Adult PT Treatment:                                                DATE: 11/07/2021 Therapeutic Exercise: Kickstand stance on Airex pad with lead foot off front edge of pad with 3# cable Pallof press 2x10 with 5-sec holds on Rt each side Eccentric heel raise on edge of Airex pad 3x12 Medial ankle slides with towel with 5# dumbbell 2x5 on Rt Lateral ankle slides with towel with 5# dumbbell 2x5 on Rt Manual Therapy: Short-to-long longitudinal stroke IASTM to Rt Achilles Tendon Rock tape calcaneal hammock strap with lateral I-band across distal Achilles tendon on Rt for passive support Neuromuscular re-ed: N/A Therapeutic Activity: N/A Modalities: N/A Self Care: N/A   [facility] Adult PT Treatment:                                                DATE: 10/31/21 Therapeutic Exercise: NuStep LEs only level 5 with PT discussing differences in between strengthening and stretching workouts and the focus Slant board stretches bil 3x30 sec R single limb arch raising/flattening exercise x 20 at the counter Standing R toe ext 1-5 x 20 Standing R ankle eversion x 20 Supine R PF/eversion stretch combo to stretch R invertors x20 R RTB eversion x 20 R toe curl/splay AROM x 20 Supine R gastroc stretch 3x30 sec  with towel Manual Therapy: R talocrural joint stretch R ankle invertor stretch Soft tissue work R invertors Manual R calcaneal lateral mobs and static stretch R achilles and invertor soft tissue work Pt states that she feels stiff when she is sitting at work and gets up; PT advised to do some seated calf/toe raises 5-10 to help to loosen her up prior and to increase her frequency with her HEP from 2-3x/day as able.    [facility] Adult PT Treatment:                                                DATE: 10/21/2021 Therapeutic Exercise: Long-sitting plantar fascia stretch with sheet x2 min on Rt Kickstand stance on Airex pad with Rt  foot forward on edge of pad with 5# kettlebell lateral hand-offs 2x20 Standing marching with slow return on Airex pad 2x20 Seated Rt ankle inversion with towel and 5# kettlebell x5 Seated Rt ankle eversion with towel and 5# kettlebell x5 Eccentric heel raises on edge of Airex pad 3x20 Standing gastroc slantboard stretch x11mn Manual Therapy: N/A Neuromuscular re-ed: N/A Therapeutic Activity: N/A Modalities: N/A Self Care: N/A     PATIENT EDUCATION:  Education details: Discussed continuing with HEP Person educated: Patient Education method: Explanation, Demonstration, and Handouts Education comprehension: verbalized understanding     HOME EXERCISE PROGRAM: Access Code: [email or web address] URL: [email or web address] Date: 10/10/2021 Prepared by: NAmerica Brown  Exercises - Arch Lifting  - 2 x daily - 7 x weekly - 1 sets - 10 reps - Seated Anterior Tibialis Stretch  - 2 x daily - 7 x weekly - 1 sets - 2-3 reps - Single Leg Stance  - 2 x daily - 7 x weekly - 1 sets - 2 reps - 30 sec hold - Towel Scrunches  - 2 x daily - 7 x weekly - 1 sets - 10 reps - Seated Plantar Fascia Stretch  - 1 x daily - 7 x weekly - 1 sets - 2 reps - 30 sec hold   ASSESSMENT:   CLINICAL IMPRESSION: Patient is a 46year old female who was referred to physical therapy for right achilles tendinitis and heel pain. Patient has attended 10 physical therapy sessions to date. Patient has made good progress since start of therapy. FOTO score increased from 57 to 69 showing improvements in overall function. She has met her goals of single limb stance. She has made good progress in her walking tolerance but is still unable to walk for 1 continuous hour or perform housechores without increase in pain at this time. She is able to demonstrate improved strength and ankle mobility at this time. Patient continues to benefit from skilled therapy services to address deficits.    OBJECTIVE IMPAIRMENTS Abnormal gait,  decreased balance, decreased coordination, decreased endurance, difficulty walking, and hypermobility .    ACTIVITY LIMITATIONS locomotion level   PARTICIPATION LIMITATIONS:  walking   PERSONAL FACTORS 1 comorbidity: weight  are also affecting patient's functional outcome.        GOALS: Goals reviewed with patient? Yes     LONG TERM GOALS: Target date:  01/21/22     Pt will be indep with HEP to assist with pain management                  Baseline: given at eval 11/07/2021: Pt reports adherence to  her HEP Goal status: ACHIEVED   2.  Pt will be able to walk 1 hour with </=3/10 pain Baseline: 8/10; 11/20/21 4/10 pain when walking 20 min on tradmill. 12/12/2021: Able to walk 30 minutes-45 minutes prior to pain greater than 4/10  Goal status: Ongoing   3.  Pt will be able to SLS >20 R LE without UE assist to assist with improved gait with reduced pain Baseline: unable; 11/20/21 30s hold w/o pain Goal status: Met   4.  Pt will demo improved foto score to 72% function Baseline: 59% function; 11/20/21 57%. 12/12/2021: FOTO of 69% today Goal status: Ongoing   5.  Pt will have improved resting calcaneal position to </=3 degrees to assist with more normalized gait Baseline: 6 degrees. 12/12/2021 5 degrees Goal status: Ongoing        PLAN: PT FREQUENCY: 1x/week   PT DURATION: 4 weeks   PLANNED INTERVENTIONS: Therapeutic exercises, Therapeutic activity, Neuromuscular re-education, Balance training, Gait training, Patient/Family education, Joint mobilization, Dry Needling, Cryotherapy, Moist heat, Taping, Ultrasound, Ionotophoresis 66m/ml Dexamethasone, Manual therapy, and Re-evaluation   PLAN FOR NEXT SESSION: progress metatarsal strengthening, stretch invertors R LE, reeval, taping as prior session if pt desires    AAwilda BillDiy, PT, DPT 12/12/2021, 11:05 AM  [place] [address]Phone:  [phone]  Fax:  [phone]

## 2021-12-19 ENCOUNTER — Encounter: Payer: Self-pay | Admitting: Rehabilitative and Restorative Service Providers"

## 2021-12-19 ENCOUNTER — Ambulatory Visit: Payer: BC Managed Care – PPO | Admitting: Rehabilitative and Restorative Service Providers"

## 2021-12-19 DIAGNOSIS — R2689 Other abnormalities of gait and mobility: Secondary | ICD-10-CM

## 2021-12-19 DIAGNOSIS — R278 Other lack of coordination: Secondary | ICD-10-CM

## 2021-12-19 DIAGNOSIS — R262 Difficulty in walking, not elsewhere classified: Secondary | ICD-10-CM | POA: Diagnosis not present

## 2021-12-19 DIAGNOSIS — M79671 Pain in right foot: Secondary | ICD-10-CM

## 2021-12-19 NOTE — Therapy (Signed)
[place], [place], [address] Phone: [phone]   Fax:  [phone]  Patient Details  Name: Lynn Reeves MRN: [id number] Date of Birth: 14-Oct-1975 Referring Provider:  Antony Blackbird, MD  Encounter Date: 12/19/2021   OUTPATIENT PHYSICAL THERAPY TREATMENT NOTE        Patient Name: Lynn Reeves MRN: [id number] DOB:12/12/1975, 46 y.o., female Today's Date: 12/19/2021  PCP: Donald Prose, MD REFERRING PROVIDER: Tyson Dense, DPM  END OF SESSION:   PT End of Session - 12/19/21 0956     Visit Number 11    Number of Visits 12    Date for PT Re-Evaluation 01/21/22    Authorization Type BCBS    PT Start Time [phone]    PT Stop Time 1032    PT Time Calculation (min) 43 min    Activity Tolerance Patient tolerated treatment well;No increased pain    Behavior During Therapy [facility] for tasks assessed/performed                      Past Medical History:  Diagnosis Date   Anxiety 04/16/2021   Arthritis    Asthma    Gastroesophageal reflux disease without esophagitis 04/16/2021   Hypertension    History reviewed. No pertinent surgical history. Patient Active Problem List   Diagnosis Date Noted   Anxiety 04/16/2021   Gastroesophageal reflux disease without esophagitis 04/16/2021   Morbid obesity ([place]) 04/16/2021   Osteoarthritis of knee 04/16/2021   Diverticulitis 01/29/2012   Enteritis 04/25/2011    Class: Acute   Hypertension 04/25/2011   Leucocytosis 04/25/2011    REFERRING DIAG: M76.61 (ICD-10-CM) - Achilles tendinitis of right lower extremity  THERAPY DIAG: Other abnormalities of gait and mobility   Other lack of coordination   Rationale for Evaluation and Treatment Rehabilitation  PERTINENT HISTORY: Encounter Date:  07/14/2021   Signed                 Miniya presents today for follow-up of her insertional Achilles tendinitis of her right heel.  She states that really is  the same she took the [place] she has tried the cam walker she is done everything that we have asked her to do to no avail.  She states that is still only about 50% improved from her initial evaluation.  Objective: Vital signs are stable alert and oriented x3.  Pulses are palpable.  She still has sharp piercing pain on palpation of the posterior aspect of the Achilles at its insertion site medially and laterally as well as centrally.  She has no proximal pain on palpation of the tendon itself.  Assessment: Most likely an insertional intrasubstance tear of the Achilles at its insertion however the only way to know this is MRI.  Plan: Requesting MRI of the right Achilles tendinous insertion highly suspect a tear secondary to failure of conservative therapy to alleviate her symptomatology.  She will notify us if she does not hear from the scheduling cen          PRECAUTIONS: None  SUBJECTIVE:   12/19/21: This week it has been achey and I don't know why but today is a 3.   12/12/2021: Patient states that since start of therapy she has felt like she has already been seeing progress. States that she really just feels that at this time her ankle gets a little stiff or achy after long periods of walking.   12/05/2021: Patient states that after last  session she had some muscle soreness but overall felt pretty good. States that she did have sharp/stabbing pains that came randomly which she doesn't usually have  11/28/2021: Patient states that overall her pain is still about the same but did notice that she went to [place] last Saturday and states that she was pleasantly surprised that she didn't have much pain.   PAIN:  Are you having pain? 3/10, ankle/achilles insertion, worse with steps(concentric)   OBJECTIVE: (objective measures completed at initial evaluation unless otherwise dated)     OBJECTIVE:    DIAGNOSTIC FINDINGS:  08/01/21 MRI: 1. No acute injury of the Achilles tendon. Mild  enthesopathic changes of the Achilles tendon insertion. 2.  No acute osseous injury of the right ankle. Past treatments: Medrol Dosepak, Cam walker, Meloxicam, HEP   PATIENT SURVEYS:  FOTO 59% function   COGNITION:           Overall cognitive status: Within functional limits for tasks assessed                          SENSATION: WFL   EDEMA: none     POSTURE:  overweight; limited arm swing   PALPATION: Hypermobile metatarsals R; toe flexion/ext WNL and = bil; calf/achilles not tender to palpation, plantar fascia tighter R but not tender; PROM R ankle = compared to L WNL   LOWER EXTREMITY MMT:   Active ROM Right eval Left eval  Hip flexion 5/5 5/5  Hip extension 3+/5 3+/5  Hip abduction      Hip adduction      Hip internal rotation      Hip external rotation      Knee flexion 5/5 5/5  Knee extension 5/5 5/5  Ankle dorsiflexion 5/5 5/5  Ankle plantarflexion 5/5 5/5  Ankle inversion 5/5 5/5  Ankle eversion 5/5 5/5   (Blank rows = not tested) Pt also able to do standing calf/toe raises without difficulty   LOWER EXTREMITY ROM: all measured supine              L calcaneal PROM inversion 10 and eversion 11; R inversion 18 and eversion 10 (measured in degrees)            Resting calcaneal position L 2 degrees and R 6 degrees Knee AROM WNL AND FULL   BALANCE: Pt unable to SLS without UE A with noticeable ankle instability     GAIT: Distance walked: IN GYM Assistive device utilized: None Level of assistance: Complete Independence Comments: coxa vara, genu valga, flat footed bil in sneakers with ankle instability noted; without shoes, L calcaneal eversion       TODAY'S TREATMENT: [facility] Adult PT Treatment:                                                  [facility] Adult PT Treatment:                                                DATE: 12/19/21 Therapeutic Exercise: EFX level 1 ramp 3 x 4 min forward with PT discussion of pain and progress Slantboard calf stretch  3x30sec Static lunge R foot forward x 20 Circular fitter  board static standing 2x1 min each Circular fitter board forward x 20, backwards x 20, right x 20, left x 20 with 1 sec pause in the middle using parallel bars Bil step calf raise x 15 with more muscle activation L > R; therefore, did 2x10 R calf raise on step Calf step stretch 2x30 sec bil Circular Fitter board in parallel bars maintaining in neutral and doing squats x 15  12/12/2021 Exercises 10 reps inversion towel sweeps with 2lbs 3x1 minute three way airex cone taps 3x1 minute bosu marching/calf stretch 2x10 reps 2 up/1 down heel raises 2x1 minute retro cable walk at 17lbs 5 minutes NuStep level 8  12/05/2021 Exercises NU step x4 minutes level 7 Heel hangs and lateral stepping airex balance beam 3x30 seconds for eccentric control Post tib heel raises with tennis ball; eccentric lower Retro gait with cable column. Emphasis on slow transition heel/toe. 2x2 minutes at 17lbs Trampoline lunges with trampoline at slight incline with focus on dorsiflexion Manual therapy Soft tissue mobilizations to gastroc, posterior tib/anterior tib, plantar fascia Grade II-III TCJ mobilizations Date: 11/28/2021 Exercises Soft tissue mobilizations right achilles, plantar fascia, gastroc. Grade II-III Talocrural joint mobilizations 3x40 seconds airex heel hangs 3x30 seconds bosu alternating heel raises 2x20 reps lunges at counter. Last rep 5 second heel raise hold 2x1 minute slant board stretch Forward and lateral towel sliders x2 minutes each Nu step 5 minutes  DATE: 11/20/21 Therapeutic Exercise: NuStep LEs only x 6 min level 4 Airex R eccentric heel raise 3 x 20 at counter with heel off Re-assessment of goals    [facility] Adult PT Treatment:                                                DATE: 11/14/21 Therapeutic Exercise: NuStep LEs only x 5 min level 6 while discussing pain Airex R eccentric heel raise 3 x 20 at counter with heel  off Airex R SLS with bil UE abdct/addct x 20 Airex squats x 20 6 inch step with Airex pad on top performing R knee bends 2x20 Slant board stretch 2x30 sec Airex pad NBOS with bil GTB row x 20 Airex pad iso minisquat with GTB bil bicep curls x 20 Static lunges R x 20 at counter Counter R iso toe heel raise with hip hinge over counter with glute set x 20 Dynamic lunges with concentration on foot positioning and balancing with each transition Slant board 2x30 sec Supine R calf stretch with sheet in neutral and then pronating foot to stretch R invertors 2x30 sec each direction Manual Therapy: R foot invertor stretch, plantar fascia stretch, calcaneal mobs grade 2   [facility] Adult PT Treatment:                                                DATE: 11/07/2021 Therapeutic Exercise: Kickstand stance on Airex pad with lead foot off front edge of pad with 3# cable Pallof press 2x10 with 5-sec holds on Rt each side Eccentric heel raise on edge of Airex pad 3x12 Medial ankle slides with towel with 5# dumbbell 2x5 on Rt Lateral ankle slides with towel with 5# dumbbell 2x5 on Rt Manual Therapy: Short-to-long longitudinal stroke IASTM to Rt Achilles Tendon  Rock tape calcaneal hammock strap with lateral I-band across distal Achilles tendon on Rt for passive support Neuromuscular re-ed: N/A Therapeutic Activity: N/A Modalities: N/A Self Care: N/A   [facility] Adult PT Treatment:                                                DATE: 10/31/21 Therapeutic Exercise: NuStep LEs only level 5 with PT discussing differences in between strengthening and stretching workouts and the focus Slant board stretches bil 3x30 sec R single limb arch raising/flattening exercise x 20 at the counter Standing R toe ext 1-5 x 20 Standing R ankle eversion x 20 Supine R PF/eversion stretch combo to stretch R invertors x20 R RTB eversion x 20 R toe curl/splay AROM x 20 Supine R gastroc stretch 3x30 sec with towel Manual  Therapy: R talocrural joint stretch R ankle invertor stretch Soft tissue work R invertors Manual R calcaneal lateral mobs and static stretch R achilles and invertor soft tissue work Pt states that she feels stiff when she is sitting at work and gets up; PT advised to do some seated calf/toe raises 5-10 to help to loosen her up prior and to increase her frequency with her HEP from 2-3x/day as able.    [facility] Adult PT Treatment:                                                DATE: 10/21/2021 Therapeutic Exercise: Long-sitting plantar fascia stretch with sheet x2 min on Rt Kickstand stance on Airex pad with Rt foot forward on edge of pad with 5# kettlebell lateral hand-offs 2x20 Standing marching with slow return on Airex pad 2x20 Seated Rt ankle inversion with towel and 5# kettlebell x5 Seated Rt ankle eversion with towel and 5# kettlebell x5 Eccentric heel raises on edge of Airex pad 3x20 Standing gastroc slantboard stretch x90mn Manual Therapy: N/A Neuromuscular re-ed: N/A Therapeutic Activity: N/A Modalities: N/A Self Care: N/A     PATIENT EDUCATION:  Education details: Discussed continuing with HEP Person educated: Patient Education method: Explanation, Demonstration, and Handouts Education comprehension: verbalized understanding     HOME EXERCISE PROGRAM: Access Code: [email or web address] URL: [email or web address] Date: 10/10/2021 Prepared by: NAmerica Brown  Exercises - Arch Lifting  - 2 x daily - 7 x weekly - 1 sets - 10 reps - Seated Anterior Tibialis Stretch  - 2 x daily - 7 x weekly - 1 sets - 2-3 reps - Single Leg Stance  - 2 x daily - 7 x weekly - 1 sets - 2 reps - 30 sec hold - Towel Scrunches  - 2 x daily - 7 x weekly - 1 sets - 10 reps - Seated Plantar Fascia Stretch  - 1 x daily - 7 x weekly - 1 sets - 2 reps - 30 sec hold   ASSESSMENT:   CLINICAL IMPRESSION: Patient is a 46year old female who was referred to physical therapy for right achilles  tendinitis and heel pain. Patient continues to benefit from skilled therapy services to address deficits. Pt tolerated all progressions in treatment well today without any increased pain; she did need to have cueing for stabilization on the fitterboard but she was able to perform without any  increased pain. No pain at tx end.    OBJECTIVE IMPAIRMENTS Abnormal gait, decreased balance, decreased coordination, decreased endurance, difficulty walking, and hypermobility .    ACTIVITY LIMITATIONS locomotion level   PARTICIPATION LIMITATIONS:  walking   PERSONAL FACTORS 1 comorbidity: weight  are also affecting patient's functional outcome.        GOALS: Goals reviewed with patient? Yes     LONG TERM GOALS: Target date:  01/21/22     Pt will be indep with HEP to assist with pain management                  Baseline: given at eval 11/07/2021: Pt reports adherence to her HEP Goal status: ACHIEVED   2.  Pt will be able to walk 1 hour with </=3/10 pain Baseline: 8/10; 11/20/21 4/10 pain when walking 20 min on tradmill. 12/12/2021: Able to walk 30 minutes-45 minutes prior to pain greater than 4/10  Goal status: Ongoing   3.  Pt will be able to SLS >20 R LE without UE assist to assist with improved gait with reduced pain Baseline: unable; 11/20/21 30s hold w/o pain Goal status: Met   4.  Pt will demo improved foto score to 72% function Baseline: 59% function; 11/20/21 57%. 12/12/2021: FOTO of 69% today Goal status: Ongoing   5.  Pt will have improved resting calcaneal position to </=3 degrees to assist with more normalized gait Baseline: 6 degrees. 12/12/2021 5 degrees Goal status: Ongoing        PLAN: PT FREQUENCY: 1x/week   PT DURATION: 4 weeks   PLANNED INTERVENTIONS: Therapeutic exercises, Therapeutic activity, Neuromuscular re-education, Balance training, Gait training, Patient/Family education, Joint mobilization, Dry Needling, Cryotherapy, Moist heat, Taping, Ultrasound, Ionotophoresis  82m/ml Dexamethasone, Manual therapy, and Re-evaluation   PLAN FOR NEXT SESSION: continue R achilles strengthening and proprioception, discuss scheduling for custom orthotic     NAmerica Brown PT, DPT 12/19/2021, 10:34 AM  [facility]G[place] [address]Phone: [phone]  Fax:  [phone]

## 2021-12-26 ENCOUNTER — Ambulatory Visit: Payer: BC Managed Care – PPO

## 2021-12-26 DIAGNOSIS — R262 Difficulty in walking, not elsewhere classified: Secondary | ICD-10-CM | POA: Diagnosis not present

## 2021-12-26 DIAGNOSIS — R2689 Other abnormalities of gait and mobility: Secondary | ICD-10-CM | POA: Diagnosis not present

## 2021-12-26 DIAGNOSIS — M79671 Pain in right foot: Secondary | ICD-10-CM | POA: Diagnosis not present

## 2021-12-26 DIAGNOSIS — R278 Other lack of coordination: Secondary | ICD-10-CM | POA: Diagnosis not present

## 2021-12-26 NOTE — Therapy (Signed)
[place], [place], [address] Phone: [phone]   Fax:  [phone]  Patient Details  Name: Lynn Reeves MRN: [id number] Date of Birth: July 05, 1975 Referring Provider:  Antony Blackbird, MD  Encounter Date: 12/26/2021   OUTPATIENT PHYSICAL THERAPY TREATMENT NOTE/ DISCHARGE SUMMARY        Patient Name: Lynn Reeves MRN: [id number] DOB:[birth year], 46 y.o., female Today's Date: 12/26/2021  PCP: Donald Prose, MD REFERRING PROVIDER: Tyson Dense, DPM  END OF SESSION:   PT End of Session - 12/26/21 1037     Visit Number 12    Number of Visits 12    Date for PT Re-Evaluation 01/21/22    Authorization Type BCBS    Progress Note Due on Visit 10    PT Start Time 1035    PT Stop Time 1113    PT Time Calculation (min) 38 min    Activity Tolerance Patient tolerated treatment well;No increased pain    Behavior During Therapy [facility] for tasks assessed/performed                       Past Medical History:  Diagnosis Date   Anxiety 04/16/2021   Arthritis    Asthma    Gastroesophageal reflux disease without esophagitis 04/16/2021   Hypertension    History reviewed. No pertinent surgical history. Patient Active Problem List   Diagnosis Date Noted   Anxiety 04/16/2021   Gastroesophageal reflux disease without esophagitis 04/16/2021   Morbid obesity ([place]) 04/16/2021   Osteoarthritis of knee 04/16/2021   Diverticulitis 01/29/2012   Enteritis 04/25/2011    Class: Acute   Hypertension 04/25/2011   Leucocytosis 04/25/2011    REFERRING DIAG: M76.61 (ICD-10-CM) - Achilles tendinitis of right lower extremity  THERAPY DIAG: Other abnormalities of gait and mobility   Other lack of coordination   Rationale for Evaluation and Treatment Rehabilitation  PERTINENT HISTORY: Encounter Date:  07/14/2021   Signed                 Lynn Reeves presents today for follow-up of her insertional Achilles  tendinitis of her right heel.  She states that really is the same she took the [place] she has tried the cam walker she is done everything that we have asked her to do to no avail.  She states that is still only about 50% improved from her initial evaluation.  Objective: Vital signs are stable alert and oriented x3.  Pulses are palpable.  She still has sharp piercing pain on palpation of the posterior aspect of the Achilles at its insertion site medially and laterally as well as centrally.  She has no proximal pain on palpation of the tendon itself.  Assessment: Most likely an insertional intrasubstance tear of the Achilles at its insertion however the only way to know this is MRI.  Plan: Requesting MRI of the right Achilles tendinous insertion highly suspect a tear secondary to failure of conservative therapy to alleviate her symptomatology.  She will notify us if she does not hear from the scheduling cen          PRECAUTIONS: None  SUBJECTIVE:  Pt reports very little to no pain the past week and reports she feels ready to be discharged to an independent HEP at this time.  PAIN:  Are you having pain? 1/10, ankle/achilles insertion   OBJECTIVE: (objective measures completed at initial evaluation unless otherwise dated)     DIAGNOSTIC FINDINGS:  08/01/21 MRI: 1.  No acute injury of the Achilles tendon. Mild enthesopathic changes of the Achilles tendon insertion. 2.  No acute osseous injury of the right ankle. Past treatments: Medrol Dosepak, Cam walker, Meloxicam, HEP   PATIENT SURVEYS:  FOTO 59% function  12/26/2021: 85%   COGNITION:           Overall cognitive status: Within functional limits for tasks assessed                          SENSATION: WFL   EDEMA: none     POSTURE:  overweight; limited arm swing   PALPATION: Hypermobile metatarsals R; toe flexion/ext WNL and = bil; calf/achilles not tender to palpation, plantar fascia tighter R but not tender; PROM R  ankle = compared to L WNL   LOWER EXTREMITY MMT:   Active ROM Right eval Left eval Right 12/26/2021 Left 12/26/2021  Hip flexion 5/5 5/5    Hip extension 3+/5 3+/5 4/5 4+/5  Hip abduction        Hip adduction        Hip internal rotation        Hip external rotation        Knee flexion 5/5 5/5    Knee extension 5/5 5/5    Ankle dorsiflexion 5/5 5/5    Ankle plantarflexion 5/5 5/5    Ankle inversion 5/5 5/5    Ankle eversion 5/5 5/5     (Blank rows = not tested) Pt also able to do standing calf/toe raises without difficulty   LOWER EXTREMITY ROM: all measured supine              L calcaneal PROM inversion 10 and eversion 11; R inversion 18 and eversion 10 (measured in degrees)            Resting calcaneal position L 2 degrees and R 6 degrees Knee AROM WNL AND FULL  12/26/2021: Resting calcaneal position: Rt: 5 degrees   BALANCE: Pt unable to SLS without UE A with noticeable ankle instability   FUNCTIONAL TESTS: 12/26/2021: Double leg hop x10: WNL   GAIT: Distance walked: IN GYM Assistive device utilized: None Level of assistance: Complete Independence Comments: coxa vara, genu valga, flat footed bil in sneakers with ankle instability noted; without shoes, L calcaneal eversion       TODAY'S TREATMENT: [facility] Adult PT Treatment:                                                DATE: 12/26/2021 Therapeutic Exercise: Heel raises on 6-inch step with slow, eccentric lowering with UE support 3x15 Bulgarian split squat with UE 2x10 BIL Standing gastrocnemius slant board stretch x89mn Side knee plank 3x30sec BIL Manual Therapy: N/A Neuromuscular re-ed: N/A Therapeutic Activity: Re-assessment of objective measures with pt Re-administration of FOTO with pt education Pt education on benefits of high heel-to-toe drop shoes and medial arch supports Modalities: N/A Self Care: N/A   [facility] Adult PT Treatment:                                                DATE:  12/19/21 Therapeutic Exercise: EFX level 1 ramp 3 x 4 min forward with PT  discussion of pain and progress Slantboard calf stretch 3x30sec Static lunge R foot forward x 20 Circular fitter board static standing 2x1 min each Circular fitter board forward x 20, backwards x 20, right x 20, left x 20 with 1 sec pause in the middle using parallel bars Bil step calf raise x 15 with more muscle activation L > R; therefore, did 2x10 R calf raise on step Calf step stretch 2x30 sec bil Circular Fitter board in parallel bars maintaining in neutral and doing squats x 15  12/12/2021 Exercises 10 reps inversion towel sweeps with 2lbs 3x1 minute three way airex cone taps 3x1 minute bosu marching/calf stretch 2x10 reps 2 up/1 down heel raises 2x1 minute retro cable walk at 17lbs 5 minutes NuStep level 8      PATIENT EDUCATION:  Education details: Discussed continuing with HEP Person educated: Patient Education method: Explanation, Demonstration, and Handouts Education comprehension: verbalized understanding     HOME EXERCISE PROGRAM: Access Code: [email or web address] URL: [email or web address] Date: 10/10/2021 Prepared by: America Brown   Exercises - Arch Lifting  - 2 x daily - 7 x weekly - 1 sets - 10 reps - Seated Anterior Tibialis Stretch  - 2 x daily - 7 x weekly - 1 sets - 2-3 reps - Single Leg Stance  - 2 x daily - 7 x weekly - 1 sets - 2 reps - 30 sec hold - Towel Scrunches  - 2 x daily - 7 x weekly - 1 sets - 10 reps - Seated Plantar Fascia Stretch  - 1 x daily - 7 x weekly - 1 sets - 2 reps - 30 sec hold  Added 12/26/2021: - Heel Raise on Step  - 1 x daily - 7 x weekly - 3 sets - 15 reps - [place] split squat  - 1 x daily - 7 x weekly - 3 sets - 10 reps   ASSESSMENT:   CLINICAL IMPRESSION: Upon re-assessment of objective measures, the pt has met all of her functional rehab goals except for calcaneal positioning goal, making good progress in hip strength, force production/ load  acceptance of Achilles' tendons, and FOTO score. Discussed with pt the potential benefits of a high heel-to-toe drop shoes, as well as medial arch support. She is discharged from PT at this time to continue to progress her updated independent HEP.   OBJECTIVE IMPAIRMENTS Abnormal gait, decreased balance, decreased coordination, decreased endurance, difficulty walking, and hypermobility .    ACTIVITY LIMITATIONS locomotion level   PARTICIPATION LIMITATIONS:  walking   PERSONAL FACTORS 1 comorbidity: weight  are also affecting patient's functional outcome.        GOALS: Goals reviewed with patient? Yes     LONG TERM GOALS: Target date:  01/21/22     Pt will be indep with HEP to assist with pain management                  Baseline: given at eval 11/07/2021: Pt reports adherence to her HEP Goal status: Met   2.  Pt will be able to walk 1 hour with </=3/10 pain Baseline: 8/10; 11/20/21 4/10 pain when walking 20 min on tradmill. 12/12/2021: Able to walk 30 minutes-45 minutes prior to pain greater than 4/10  Pt reports ability to walk 1 hour with <2/10 pain Goal status: Met   3.  Pt will be able to SLS >20 R LE without UE assist to assist with improved gait with reduced pain Baseline: unable; 11/20/21 30s hold w/o pain  Goal status: Met   4.  Pt will demo improved foto score to 72% function Baseline: 59% function; 11/20/21 57%. 12/12/2021: FOTO of 69% today 12/26/2021: 85% Goal status: Met   5.  Pt will have improved resting calcaneal position to </=3 degrees to assist with more normalized gait Baseline: 6 degrees. 12/12/2021 5 degrees 12/26/2021: 5 degrees Goal status: Not Met       PLAN: PT FREQUENCY: 1x/week   PT DURATION: 4 weeks   PLANNED INTERVENTIONS: Therapeutic exercises, Therapeutic activity, Neuromuscular re-education, Balance training, Gait training, Patient/Family education, Joint mobilization, Dry Needling, Cryotherapy, Moist heat, Taping, Ultrasound, Ionotophoresis  64m/ml Dexamethasone, Manual therapy, and Re-evaluation   PLAN FOR NEXT SESSION: Pt is discharged from PT at this time.   PHYSICAL THERAPY DISCHARGE SUMMARY  Visits from Start of Care: 12  Current functional level related to goals / functional outcomes: Pt has met all of her functional rehab goals.   Remaining deficits: Mild Posterior heel pain with activity   Education / Equipment: HEP   Patient agrees to discharge. Patient goals were met. Patient is being discharged due to meeting the stated rehab goals.   YVanessa [place] PT, DPT 12/26/21 11:13 AM

## 2022-02-19 NOTE — Progress Notes (Signed)
Office Visit Note  Patient: Lynn Reeves             Date of Birth: Aug 26, 1975           MRN: [id number]             PCP: Donald Prose, MD Referring: Garrel Ridgel, DPM Visit Date: 02/26/2022 Occupation: '@GUAROCC' @  Subjective:  Positive ANA, Achilles tendinitis  History of Present Illness: Lynn Reeves is a 46 y.o. female seen in consultation per request of Dr. Milinda Pointer.  According to the patient her symptoms a started in summer 2022 with right foot pain.  She states she was initially seen by an orthopedic surgeon who diagnosed her with Achilles tendinitis.  Was given some exercises but they did not help.  In December 2022 her symptoms got worse.  She was evaluated by Dr. Milinda Pointer who gave a cortisone injection in her heel which helped her for a month she was also in a boot for a month.  A month later her symptoms got worse and she had second cortisone shot which lasted for about 3 weeks.  At that time she had MRI of her ankle which was negative and lab work showed positive ANA.  She had been going to physical therapy and had total of 12 sessions.  The symptoms of her foot pain has been improving but not completely resolved.  She also had recurrent right trochanteric bursitis for many years.  She has had cortisone injections for trochanteric bursa in the past.  None of the other joints are painful.  There is no history of oral ulcers, nasal ulcers, malar rash, photosensitivity, Raynaud's phenomenon or lymphadenopathy.  There is no history of inflammatory arthritis.  There is no family history of autoimmune disease.  She is gravida 2, para 1, miscarriage 1.  There is no history of DVTs.  She goes to the gym 3 times a week.  She uses elliptical, stationary bike, treadmill and walks the laps.  Also does strength training.  Activities of Daily Living:  Patient reports morning stiffness for 15-30 minutes.   Patient Reports nocturnal pain.  Difficulty dressing/grooming: Denies Difficulty climbing  stairs: Denies Difficulty getting out of chair: Denies Difficulty using hands for taps, buttons, cutlery, and/or writing: Denies  Review of Systems  Constitutional:  Positive for fatigue.  HENT:  Positive for mouth dryness. Negative for mouth sores.   Eyes:  Negative for dryness.  Respiratory:  Negative for shortness of breath.   Cardiovascular:  Negative for chest pain and palpitations.  Gastrointestinal:  Negative for blood in stool, constipation and diarrhea.  Endocrine: Negative for increased urination.  Genitourinary:  Negative for involuntary urination.  Musculoskeletal:  Positive for joint pain, joint pain and morning stiffness. Negative for gait problem, joint swelling, myalgias, muscle weakness, muscle tenderness and myalgias.  Skin:  Negative for color change, rash, hair loss and sensitivity to sunlight.  Allergic/Immunologic: Negative for susceptible to infections.  Neurological:  Negative for dizziness and headaches.  Hematological:  Negative for swollen glands.  Psychiatric/Behavioral:  Positive for sleep disturbance. Negative for depressed mood. The patient is not nervous/anxious.     PMFS History:  Patient Active Problem List   Diagnosis Date Noted   Anxiety 04/16/2021   Gastroesophageal reflux disease without esophagitis 04/16/2021   Morbid obesity ([place]) 04/16/2021   Osteoarthritis of knee 04/16/2021   Diverticulitis 01/29/2012   Enteritis 04/25/2011    Class: Acute   Hypertension 04/25/2011   Leucocytosis 04/25/2011    Past  Medical History:  Diagnosis Date   Anxiety 04/16/2021   Arthritis    Asthma    Diverticulitis    Gastroesophageal reflux disease without esophagitis 04/16/2021   Hypertension     Family History  Problem Relation Age of Onset   Diabetes Mother    Stroke Mother    Hypertension Mother    Healthy Brother    Healthy Brother    Healthy Son    History reviewed. No pertinent surgical history. Social History   Social History Narrative    Not on file   Immunization History  Administered Date(s) Administered   Influenza, Quadrivalent, Recombinant, Inj, Pf 02/20/2019, 03/07/2020, 02/25/2021   Moderna SARS-COV2 Booster Vaccination 04/23/2020   Moderna Sars-Covid-2 Vaccination 07/05/2019, 08/07/2019, 04/23/2020   Td 05/03/2006   Tdap 09/03/2016     Objective: Vital Signs: BP 135/84 (BP Location: Right Arm, Patient Position: Sitting, Cuff Size: Large)   Pulse 64   Resp 18   Ht '5\' 4"'  (1.626 m)   Wt 262 lb 3.2 oz (118.9 kg)   BMI 45.01 kg/m    Physical Exam Vitals and nursing note reviewed.  Constitutional:      Appearance: She is well-developed.  HENT:     Head: Normocephalic and atraumatic.  Eyes:     Conjunctiva/sclera: Conjunctivae normal.  Cardiovascular:     Rate and Rhythm: Normal rate and regular rhythm.     Heart sounds: Normal heart sounds.  Pulmonary:     Effort: Pulmonary effort is normal.     Breath sounds: Normal breath sounds.  Abdominal:     General: Bowel sounds are normal.     Palpations: Abdomen is soft.  Musculoskeletal:     Cervical back: Normal range of motion.  Lymphadenopathy:     Cervical: No cervical adenopathy.  Skin:    General: Skin is warm and dry.     Capillary Refill: Capillary refill takes less than 2 seconds.  Neurological:     Mental Status: She is alert and oriented to person, place, and time.  Psychiatric:        Behavior: Behavior normal.      Musculoskeletal Exam: Cervical thoracic and lumbar spine were in good range of motion.  Shoulder joints, elbow joints, wrist joints, MCPs PIPs and DIPs been good range of motion with no synovitis.  Hip joints with good range of motion.  She had tenderness over right trochanteric bursa.  Knee joints with good range of motion.  There was no tenderness over plantar fascia or Achilles tendon.  There was no tenderness over ankles or MTPs.  CDAI Exam: CDAI Score: -- Patient Global: --; Provider Global: -- Swollen: --; Tender:  -- Joint Exam 02/26/2022   No joint exam has been documented for this visit   There is currently no information documented on the homunculus. Go to the Rheumatology activity and complete the homunculus joint exam.  Investigation: No additional findings.  Imaging: No results found.  Recent Labs: Lab Results  Component Value Date   WBC 11.1 (A) 01/29/2012   HGB 13.9 01/29/2012   PLT 285 04/26/2011   NA 139 01/29/2012   K 4.0 01/29/2012   CL 102 01/29/2012   CO2 29 01/29/2012   GLUCOSE 93 01/29/2012   BUN 14 01/29/2012   CREATININE 1.15 (H) 01/29/2012   BILITOT 0.6 01/29/2012   ALKPHOS 53 01/29/2012   AST 15 01/29/2012   ALT 12 01/29/2012   PROT 7.3 01/29/2012   ALBUMIN 4.3 01/29/2012   CALCIUM  9.1 01/29/2012   GFRAA 85 (L) 04/26/2011   IMPRESSION: 1. No acute injury of the Achilles tendon. Mild enthesopathic changes of the Achilles tendon insertion. 2.  No acute osseous injury of the right ankle.     Electronically Signed   By: Kathreen Devoid M.D.   On: 08/01/2021 13:56  Speciality Comments: No specialty comments available.  Procedures:  No procedures performed Allergies: Sulfamethoxazole-trimethoprim   Assessment / Plan:     Visit Diagnoses: Positive ANA (antinuclear antibody) - 08/19/21: ANA 1:160NH, CRP 19.8, RF<14, ESR 14, uric acid 6.0 -content positive ANA.  There is no history of oral ulcers, nasal ulcers, malar rash, photosensitivity, Raynaud's phenomenon or lymphadenopathy.  She has some joint pain with no synovitis.  I will obtain additional labs today.  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  She has been followed by Dr. Milinda Pointer.  She was treated with cortisone injections x2 and also was in a boot for a while.  She has been going to physical therapy and had total of 12 sessions.  She has noticed improvement in her symptoms.  She had no tenderness over Achilles tendon or plantar fascia today.  I will obtain additional labs today.  Plan: Cyclic citrul peptide antibody, IgG, HLA-B27 antigen.  I reviewed x-rays of the right foot.  I also reviewed the MRI of the right foot from August 01, 2021 which showed mild Achilles tendinitis.  Findings were discussed with the patient.  Trochanteric bursitis, right hip-she has intermittent discomfort in the right trochanteric bursa for the last few years.  She has cortisone injection in the past.  IT band stretches were discussed.  A handout on IT band stretches was given.  She had x-rays of bilateral hip joints in December 2021 which were unremarkable.  Primary osteoarthritis of both knees-she has intermittent discomfort in her knee joints.  She had good range of motion of bilateral knee joints today without any warmth swelling or effusion.  X-rays from December 2021 of bilateral knee joints were reviewed which showed mild osteoarthritis and mild chondromalacia patella.  Primary hypertension-blood pressure was normal today.  Gastroesophageal reflux disease without esophagitis-she did meloxicam intermittently.  Avoidance of meloxicam was discussed.  Diverticulitis  Anxiety  Orders: Orders Placed This Encounter  Procedures   Sedimentation rate   Cyclic citrul peptide antibody, IgG   HLA-B27 antigen   ANA   RNP Antibody   Anti-Smith antibody   Sjogrens syndrome-A extractable nuclear antibody   Sjogrens syndrome-B extractable nuclear antibody   Anti-DNA antibody, double-stranded   C3 and C4   Protein / creatinine ratio, urine   CBC with Differential/Platelet   COMPLETE METABOLIC PANEL WITH GFR   No orders of the defined types were  placed in this encounter.    Follow-Up Instructions: Return for Positive ANA, Achilles tendinitis.   Bo Merino, MD  Note - This record has been created using Editor, commissioning.  Chart creation errors have been sought, but may not always  have been located. Such creation errors do not reflect on  the standard of medical care.

## 2022-02-26 ENCOUNTER — Ambulatory Visit: Payer: BC Managed Care – PPO | Attending: Rheumatology | Admitting: Rheumatology

## 2022-02-26 ENCOUNTER — Encounter: Payer: Self-pay | Admitting: Rheumatology

## 2022-02-26 VITALS — BP 135/84 | HR 64 | Resp 18 | Ht 64.0 in | Wt 262.2 lb

## 2022-02-26 DIAGNOSIS — I10 Essential (primary) hypertension: Secondary | ICD-10-CM

## 2022-02-26 DIAGNOSIS — M170 Bilateral primary osteoarthritis of knee: Secondary | ICD-10-CM

## 2022-02-26 DIAGNOSIS — F419 Anxiety disorder, unspecified: Secondary | ICD-10-CM

## 2022-02-26 DIAGNOSIS — M7061 Trochanteric bursitis, right hip: Secondary | ICD-10-CM

## 2022-02-26 DIAGNOSIS — R768 Other specified abnormal immunological findings in serum: Secondary | ICD-10-CM

## 2022-02-26 DIAGNOSIS — M7661 Achilles tendinitis, right leg: Secondary | ICD-10-CM | POA: Diagnosis not present

## 2022-02-26 DIAGNOSIS — Z862 Personal history of diseases of the blood and blood-forming organs and certain disorders involving the immune mechanism: Secondary | ICD-10-CM

## 2022-02-26 DIAGNOSIS — K5792 Diverticulitis of intestine, part unspecified, without perforation or abscess without bleeding: Secondary | ICD-10-CM

## 2022-02-26 DIAGNOSIS — K219 Gastro-esophageal reflux disease without esophagitis: Secondary | ICD-10-CM

## 2022-02-26 NOTE — Patient Instructions (Signed)
Iliotibial Band Syndrome Rehab Ask your health care provider which exercises are safe for you. Do exercises exactly as told by your health care provider and adjust them as directed. It is normal to feel mild stretching, pulling, tightness, or discomfort as you do these exercises. Stop right away if you feel sudden pain or your pain gets significantly worse. Do not begin these exercises until told by your health care provider. Stretching and range-of-motion exercises These exercises warm up your muscles and joints and improve the movement and flexibility of your hip and pelvis. Quadriceps stretch, prone  Lie on your abdomen (prone position) on a firm surface, such as a bed or padded floor. Bend your left / right knee and reach back to hold your ankle or pant leg. If you cannot reach your ankle or pant leg, loop a belt around your foot and grab the belt instead. Gently pull your heel toward your buttocks. Your knee should not slide out to the side. You should feel a stretch in the front of your thigh and knee (quadriceps). Hold this position for __________ seconds. Repeat __________ times. Complete this exercise __________ times a day. Iliotibial band stretch An iliotibial band is a strong band of muscle tissue that runs from the outer side of your hip to the outer side of your thigh and knee. Lie on your side with your left / right leg in the top position. Bend both of your knees and grab your left / right ankle. Stretch out your bottom arm to help you balance. Slowly bring your top knee back so your thigh goes behind your trunk. Slowly lower your top leg toward the floor until you feel a gentle stretch on the outside of your left / right hip and thigh. If you do not feel a stretch and your knee will not fall farther, place the heel of your other foot on top of your knee and pull your knee down toward the floor with your foot. Hold this position for __________ seconds. Repeat __________ times.  Complete this exercise __________ times a day. Strengthening exercises These exercises build strength and endurance in your hip and pelvis. Endurance is the ability to use your muscles for a long time, even after they get tired. Straight leg raises, side-lying This exercise strengthens the muscles that rotate the leg at the hip and move it away from your body (hip abductors). Lie on your side with your left / right leg in the top position. Lie so your head, shoulder, hip, and knee line up. You may bend your bottom knee to help you balance. Roll your hips slightly forward so your hips are stacked directly over each other and your left / right knee is facing forward. Tense the muscles in your outer thigh and lift your top leg 4-6 inches (10-15 cm). Hold this position for __________ seconds. Slowly lower your leg to return to the starting position. Let your muscles relax completely before doing another repetition. Repeat __________ times. Complete this exercise __________ times a day. Leg raises, prone This exercise strengthens the muscles that move the hips backward (hip extensors). Lie on your abdomen (prone position) on your bed or a firm surface. You can put a pillow under your hips if that is more comfortable for your lower back. Bend your left / right knee so your foot is straight up in the air. Squeeze your buttocks muscles and lift your left / right thigh off the bed. Do not let your back arch. Tense   your thigh muscle as hard as you can without increasing any knee pain. Hold this position for __________ seconds. Slowly lower your leg to return to the starting position and allow it to relax completely. Repeat __________ times. Complete this exercise __________ times a day. Hip hike Stand sideways on a bottom step. Stand on your left / right leg with your other foot unsupported next to the step. You can hold on to a railing or wall for balance if needed. Keep your knees straight and your  torso square. Then lift your left / right hip up toward the ceiling. Slowly let your left / right hip lower toward the floor, past the starting position. Your foot should get closer to the floor. Do not lean or bend your knees. Repeat __________ times. Complete this exercise __________ times a day. This information is not intended to replace advice given to you by your health care provider. Make sure you discuss any questions you have with your health care provider. Document Revised: 06/27/2019 Document Reviewed: 06/27/2019 Elsevier Patient Education  2023 [employer]  

## 2022-03-01 NOTE — Progress Notes (Signed)
I will discuss results at the follow-up visit.

## 2022-03-03 LAB — CBC WITH DIFFERENTIAL/PLATELET
Absolute Monocytes: 342 cells/uL (ref 200–950)
Basophils Absolute: 58 cells/uL (ref 0–200)
Basophils Relative: 1 %
Eosinophils Absolute: 81 cells/uL (ref 15–500)
Eosinophils Relative: 1.4 %
HCT: 38.1 % (ref 35.0–45.0)
Hemoglobin: 12.5 g/dL (ref 11.7–15.5)
Lymphs Abs: 1566 cells/uL (ref 850–3900)
MCH: 30.4 pg (ref 27.0–33.0)
MCHC: 32.8 g/dL (ref 32.0–36.0)
MCV: 92.7 fL (ref 80.0–100.0)
MPV: 9.9 fL (ref 7.5–12.5)
Monocytes Relative: 5.9 %
Neutro Abs: 3753 cells/uL (ref 1500–7800)
Neutrophils Relative %: 64.7 %
Platelets: 329 10*3/uL (ref 140–400)
RBC: 4.11 10*6/uL (ref 3.80–5.10)
RDW: 11.8 % (ref 11.0–15.0)
Total Lymphocyte: 27 %
WBC: 5.8 10*3/uL (ref 3.8–10.8)

## 2022-03-03 LAB — C3 AND C4
C3 Complement: 214 mg/dL — ABNORMAL HIGH (ref 83–193)
C4 Complement: 71 mg/dL — ABNORMAL HIGH (ref 15–57)

## 2022-03-03 LAB — COMPLETE METABOLIC PANEL WITH GFR
AG Ratio: 1.4 (calc) (ref 1.0–2.5)
ALT: 11 U/L (ref 6–29)
AST: 19 U/L (ref 10–35)
Albumin: 4.2 g/dL (ref 3.6–5.1)
Alkaline phosphatase (APISO): 50 U/L (ref 31–125)
BUN/Creatinine Ratio: 15 (calc) (ref 6–22)
BUN: 16 mg/dL (ref 7–25)
CO2: 27 mmol/L (ref 20–32)
Calcium: 9.5 mg/dL (ref 8.6–10.2)
Chloride: 105 mmol/L (ref 98–110)
Creat: 1.04 mg/dL — ABNORMAL HIGH (ref 0.50–0.99)
Globulin: 3.1 g/dL (calc) (ref 1.9–3.7)
Glucose, Bld: 87 mg/dL (ref 65–99)
Potassium: 4.5 mmol/L (ref 3.5–5.3)
Sodium: 142 mmol/L (ref 135–146)
Total Bilirubin: 0.6 mg/dL (ref 0.2–1.2)
Total Protein: 7.3 g/dL (ref 6.1–8.1)
eGFR: 66 mL/min/{1.73_m2} (ref >=60)

## 2022-03-03 LAB — SJOGRENS SYNDROME-A EXTRACTABLE NUCLEAR ANTIBODY: SSA (Ro) (ENA) Antibody, IgG: <1 AI

## 2022-03-03 LAB — PROTEIN / CREATININE RATIO, URINE
Creatinine, Urine: 85 mg/dL (ref 20–275)
Protein/Creat Ratio: 59 mg/g creat (ref 24–184)
Protein/Creatinine Ratio: 0.059 mg/mg creat (ref 0.024–0.184)
Total Protein, Urine: 5 mg/dL (ref 5–24)

## 2022-03-03 LAB — SJOGRENS SYNDROME-B EXTRACTABLE NUCLEAR ANTIBODY: SSB (La) (ENA) Antibody, IgG: <1 AI

## 2022-03-03 LAB — ANA: Anti Nuclear Antibody (ANA): POSITIVE — AB

## 2022-03-03 LAB — ANTI-SMITH ANTIBODY: ENA SM Ab Ser-aCnc: <1 AI

## 2022-03-03 LAB — ANTI-DNA ANTIBODY, DOUBLE-STRANDED: ds DNA Ab: <1 IU/mL

## 2022-03-03 LAB — ANTI-NUCLEAR AB-TITER (ANA TITER): ANA Titer 1: 1:80 {titer} — ABNORMAL HIGH

## 2022-03-03 LAB — RNP ANTIBODY: Ribonucleic Protein(ENA) Antibody, IgG: <1 AI

## 2022-03-03 LAB — SEDIMENTATION RATE: Sed Rate: 19 mm/h (ref 0–20)

## 2022-03-03 LAB — HLA-B27 ANTIGEN: HLA-B27 Antigen: NEGATIVE

## 2022-03-03 LAB — CYCLIC CITRUL PEPTIDE ANTIBODY, IGG: Cyclic Citrullin Peptide Ab: 24 UNITS — ABNORMAL HIGH

## 2022-03-15 NOTE — Progress Notes (Signed)
Office Visit Note  Patient: Lynn Reeves             Date of Birth: [birth year]           MRN: [id number]             PCP: Donald Prose, MD Referring: Donald Prose, MD Visit Date: 03/24/2022 Occupation: _0 @  Subjective:  Positive ANA  History of Present Illness: Lynn Reeves is a 46 y.o. female returns today for the evaluation of positive ANA.  She states her Achilles tendinitis is better.  She has noticed improvement in her trochanteric bursitis after doing IT band stretches.  None of the other joints are painful or swollen.  She denies any history of oral ulcers, nasal ulcers, malar rash, photosensitivity, Raynaud's phenomenon, lymphadenopathy or inflammatory arthritis.  She has intermittent discomfort in her knee joints.  Activities of Daily Living:  Patient reports morning stiffness for 30-45 minutes.   Patient Reports nocturnal pain.  Difficulty dressing/grooming: Denies Difficulty climbing stairs: Denies Difficulty getting out of chair: Denies Difficulty using hands for taps, buttons, cutlery, and/or writing: Denies  Review of Systems  Constitutional:  Positive for fatigue.  HENT:  Positive for mouth dryness. Negative for mouth sores.   Eyes:  Negative for dryness.  Respiratory:  Negative for shortness of breath.   Cardiovascular:  Negative for chest pain and palpitations.  Gastrointestinal:  Negative for blood in stool, constipation and diarrhea.  Endocrine: Negative for increased urination.  Genitourinary:  Negative for involuntary urination.  Musculoskeletal:  Positive for morning stiffness. Negative for joint pain, gait problem, joint pain, joint swelling, myalgias, muscle weakness, muscle tenderness and myalgias.  Skin:  Negative for color change, rash, hair loss and sensitivity to sunlight.  Allergic/Immunologic: Negative for susceptible to infections.  Neurological:  Negative for dizziness and headaches.  Hematological:  Negative for swollen glands.   Psychiatric/Behavioral:  Negative for depressed mood and sleep disturbance. The patient is not nervous/anxious.     PMFS History:  Patient Active Problem List   Diagnosis Date Noted   Anxiety 04/16/2021   Gastroesophageal reflux disease without esophagitis 04/16/2021   Morbid obesity ([place]) 04/16/2021   Osteoarthritis of knee 04/16/2021   Diverticulitis 01/29/2012   Enteritis 04/25/2011    Class: Acute   Hypertension 04/25/2011   Leucocytosis 04/25/2011    Past Medical History:  Diagnosis Date   Anxiety 04/16/2021   Arthritis    Asthma    Diverticulitis    Gastroesophageal reflux disease without esophagitis 04/16/2021   Hypertension     Family History  Problem Relation Age of Onset   Diabetes Mother    Stroke Mother    Hypertension Mother    Healthy Brother    Healthy Brother    Healthy Son    History reviewed. No pertinent surgical history. Social History   Social History Narrative   Not on file   Immunization History  Administered Date(s) Administered   Influenza, Quadrivalent, Recombinant, Inj, Pf 02/20/2019, 03/07/2020, 02/25/2021   Moderna SARS-COV2 Booster Vaccination 04/23/2020   Moderna Sars-Covid-2 Vaccination 07/05/2019, 08/07/2019, 04/23/2020   Td 05/03/2006   Tdap 09/03/2016     Objective: Vital Signs: BP 132/85 (BP Location: Left Arm, Patient Position: Sitting, Cuff Size: Large)   Pulse 94   Resp 16   Ht _1  (1.626 m)   Wt 261 lb 3.2 oz (118.5 kg)   BMI 44.83 kg/m    Physical Exam Vitals and nursing note reviewed.  Constitutional:  Appearance: She is well-developed.  HENT:     Head: Normocephalic and atraumatic.  Eyes:     Conjunctiva/sclera: Conjunctivae normal.  Cardiovascular:     Rate and Rhythm: Normal rate and regular rhythm.     Heart sounds: Normal heart sounds.  Pulmonary:     Effort: Pulmonary effort is normal.     Breath sounds: Normal breath sounds.  Abdominal:     General: Bowel sounds are normal.      Palpations: Abdomen is soft.  Musculoskeletal:     Cervical back: Normal range of motion.  Lymphadenopathy:     Cervical: No cervical adenopathy.  Skin:    General: Skin is warm and dry.     Capillary Refill: Capillary refill takes less than 2 seconds.  Neurological:     Mental Status: She is alert and oriented to person, place, and time.  Psychiatric:        Behavior: Behavior normal.      Musculoskeletal Exam: Cervical, thoracic and lumbar spine were in good range of motion.  Shoulder joints, elbow joints, wrist joints with good range of motion without discomfort.  There was no synovitis or tenderness over MCPs PIPs or DIPs.  Hip joints and knee joints with good range of motion without any warmth swelling or effusion.  There was no tenderness over ankles or MTPs.  She had mild tenderness over right trochanteric region.  CDAI Exam: CDAI Score: -- Patient Global: --; Provider Global: -- Swollen: --; Tender: -- Joint Exam 03/24/2022   No joint exam has been documented for this visit   There is currently no information documented on the homunculus. Go to the Rheumatology activity and complete the homunculus joint exam.  Investigation: No additional findings.  Imaging: No results found.  Recent Labs: Lab Results  Component Value Date   WBC 5.8 02/26/2022   HGB 12.5 02/26/2022   PLT 329 02/26/2022   NA 142 02/26/2022   K 4.5 02/26/2022   CL 105 02/26/2022   CO2 27 02/26/2022   GLUCOSE 87 02/26/2022   BUN 16 02/26/2022   CREATININE 1.04 (H) 02/26/2022   BILITOT 0.6 02/26/2022   ALKPHOS 53 01/29/2012   AST 19 02/26/2022   ALT 11 02/26/2022   PROT 7.3 02/26/2022   ALBUMIN 4.3 01/29/2012   CALCIUM 9.5 02/26/2022   GFRAA 85 (L) 04/26/2011     08/19/21: ANA 1:160NH, CRP 19.8, RF<14, ESR 14, uric acid 6.0   Speciality Comments: No specialty comments available.  Procedures:  No procedures performed Allergies: Sulfamethoxazole-trimethoprim   Assessment / Plan:      Visit Diagnoses: Positive ANA (antinuclear antibody) - ANA low titer positive, ENA negative, complements normal.  Patient had no clinical features of lupus or lupus-like syndrome.February 26, 2022 protein creatinine ratio normal, C3-C4 high, ANA 1: 80 NH, ENA (Smith, RNP, dsDNA, SSA, SSB) negative, ESR 19, anti-CCP 24 (weak positive ), Labs obtained at the last visit were reviewed.  ANA was low titer positive.  ENA was negative and complements were normal.  Patient denies history of oral ulcers, nasal ulcers, malar rash, photosensitivity, Raynaud's phenomenon or lymphadenopathy.  There is no inflammatory arthritis.  I advised her to contact me in case she develops any new symptoms.  Anti-CCP antibody was weak positive.  She had no inflammation on the examination today.  Achilles tendinitis, right leg - History of recurrent Achilles tendinitis.  She had response to course on injection done by the Dr. Milinda Pointer.  Symptoms are improving with PT.  HLA-B 27 negative.  Trochanteric bursitis, right hip - History of intermittent discomfort.  A handout on IT band stretches was given at the last visit.  She has noticed improvement in her symptoms by doing stretching exercises.  Primary osteoarthritis of both knees - No warmth swelling or effusion was noted.  X-rays from 2021 showed mild osteoarthritis and chondromalacia patella.  She has intermittent discomfort.  She is currently asymptomatic.  Diverticulitis  Gastroesophageal reflux disease without esophagitis - Avoiding meloxicam was discussed.  Primary hypertension-blood pressure was normal today.  Anxiety  Orders: No orders of the defined types were placed in this encounter.  No orders of the defined types were placed in this encounter.   Follow-Up Instructions: Return if symptoms worsen or fail to improve, for +ANA.   Bo Merino, MD  Note - This record has been created using Editor, commissioning.  Chart creation errors have been sought, but may  not always  have been located. Such creation errors do not reflect on  the standard of medical care.

## 2022-03-17 ENCOUNTER — Ambulatory Visit: Payer: BC Managed Care – PPO | Admitting: Rheumatology

## 2022-03-24 ENCOUNTER — Ambulatory Visit: Payer: BC Managed Care – PPO | Attending: Rheumatology | Admitting: Rheumatology

## 2022-03-24 ENCOUNTER — Encounter: Payer: Self-pay | Admitting: Rheumatology

## 2022-03-24 VITALS — BP 132/85 | HR 94 | Resp 16 | Ht 64.0 in | Wt 261.2 lb

## 2022-03-24 DIAGNOSIS — I10 Essential (primary) hypertension: Secondary | ICD-10-CM

## 2022-03-24 DIAGNOSIS — M7661 Achilles tendinitis, right leg: Secondary | ICD-10-CM

## 2022-03-24 DIAGNOSIS — R768 Other specified abnormal immunological findings in serum: Secondary | ICD-10-CM

## 2022-03-24 DIAGNOSIS — M170 Bilateral primary osteoarthritis of knee: Secondary | ICD-10-CM | POA: Diagnosis not present

## 2022-03-24 DIAGNOSIS — K5792 Diverticulitis of intestine, part unspecified, without perforation or abscess without bleeding: Secondary | ICD-10-CM

## 2022-03-24 DIAGNOSIS — K219 Gastro-esophageal reflux disease without esophagitis: Secondary | ICD-10-CM

## 2022-03-24 DIAGNOSIS — M7061 Trochanteric bursitis, right hip: Secondary | ICD-10-CM

## 2022-03-24 DIAGNOSIS — F419 Anxiety disorder, unspecified: Secondary | ICD-10-CM

## 2022-04-06 DIAGNOSIS — I10 Essential (primary) hypertension: Secondary | ICD-10-CM | POA: Diagnosis not present

## 2022-04-06 DIAGNOSIS — Z0000 Encounter for general adult medical examination without abnormal findings: Secondary | ICD-10-CM | POA: Diagnosis not present

## 2022-04-06 DIAGNOSIS — Z13220 Encounter for screening for lipoid disorders: Secondary | ICD-10-CM | POA: Diagnosis not present

## 2022-04-28 DIAGNOSIS — Z01419 Encounter for gynecological examination (general) (routine) without abnormal findings: Secondary | ICD-10-CM | POA: Diagnosis not present

## 2022-04-28 DIAGNOSIS — Z6841 Body Mass Index (BMI) 40.0 and over, adult: Secondary | ICD-10-CM | POA: Diagnosis not present

## 2022-04-28 DIAGNOSIS — Z1231 Encounter for screening mammogram for malignant neoplasm of breast: Secondary | ICD-10-CM | POA: Diagnosis not present

## 2022-10-06 DIAGNOSIS — I10 Essential (primary) hypertension: Secondary | ICD-10-CM | POA: Diagnosis not present

## 2022-10-06 DIAGNOSIS — K219 Gastro-esophageal reflux disease without esophagitis: Secondary | ICD-10-CM | POA: Diagnosis not present

## 2023-04-19 DIAGNOSIS — Z13220 Encounter for screening for lipoid disorders: Secondary | ICD-10-CM | POA: Diagnosis not present

## 2023-04-19 DIAGNOSIS — Z0000 Encounter for general adult medical examination without abnormal findings: Secondary | ICD-10-CM | POA: Diagnosis not present

## 2023-04-19 DIAGNOSIS — Z23 Encounter for immunization: Secondary | ICD-10-CM | POA: Diagnosis not present

## 2023-04-19 DIAGNOSIS — I10 Essential (primary) hypertension: Secondary | ICD-10-CM | POA: Diagnosis not present

## 2023-05-02 DIAGNOSIS — Z1231 Encounter for screening mammogram for malignant neoplasm of breast: Secondary | ICD-10-CM | POA: Diagnosis not present

## 2023-05-02 DIAGNOSIS — B3731 Acute candidiasis of vulva and vagina: Secondary | ICD-10-CM | POA: Diagnosis not present

## 2023-05-02 DIAGNOSIS — Z01419 Encounter for gynecological examination (general) (routine) without abnormal findings: Secondary | ICD-10-CM | POA: Diagnosis not present

## 2023-05-02 DIAGNOSIS — Z1331 Encounter for screening for depression: Secondary | ICD-10-CM | POA: Diagnosis not present

## 2023-05-02 DIAGNOSIS — Z124 Encounter for screening for malignant neoplasm of cervix: Secondary | ICD-10-CM | POA: Diagnosis not present

## 2023-05-02 DIAGNOSIS — N760 Acute vaginitis: Secondary | ICD-10-CM | POA: Diagnosis not present

## 2023-05-06 DIAGNOSIS — J019 Acute sinusitis, unspecified: Secondary | ICD-10-CM | POA: Diagnosis not present

## 2023-05-06 DIAGNOSIS — R051 Acute cough: Secondary | ICD-10-CM | POA: Diagnosis not present

## 2023-10-17 DIAGNOSIS — I10 Essential (primary) hypertension: Secondary | ICD-10-CM | POA: Diagnosis not present

## 2023-10-17 DIAGNOSIS — K219 Gastro-esophageal reflux disease without esophagitis: Secondary | ICD-10-CM | POA: Diagnosis not present

## 2023-12-05 DIAGNOSIS — Z131 Encounter for screening for diabetes mellitus: Secondary | ICD-10-CM | POA: Diagnosis not present

## 2023-12-05 DIAGNOSIS — E8889 Other specified metabolic disorders: Secondary | ICD-10-CM | POA: Diagnosis not present

## 2023-12-05 DIAGNOSIS — E65 Localized adiposity: Secondary | ICD-10-CM | POA: Diagnosis not present

## 2023-12-05 DIAGNOSIS — I10 Essential (primary) hypertension: Secondary | ICD-10-CM | POA: Diagnosis not present

## 2023-12-05 DIAGNOSIS — Z1331 Encounter for screening for depression: Secondary | ICD-10-CM | POA: Diagnosis not present

## 2023-12-05 DIAGNOSIS — E7849 Other hyperlipidemia: Secondary | ICD-10-CM | POA: Diagnosis not present

## 2023-12-19 DIAGNOSIS — E8889 Other specified metabolic disorders: Secondary | ICD-10-CM | POA: Diagnosis not present

## 2023-12-19 DIAGNOSIS — R7303 Prediabetes: Secondary | ICD-10-CM | POA: Diagnosis not present

## 2023-12-19 DIAGNOSIS — I10 Essential (primary) hypertension: Secondary | ICD-10-CM | POA: Diagnosis not present

## 2023-12-19 DIAGNOSIS — E7849 Other hyperlipidemia: Secondary | ICD-10-CM | POA: Diagnosis not present

## 2024-01-04 DIAGNOSIS — E7849 Other hyperlipidemia: Secondary | ICD-10-CM | POA: Diagnosis not present

## 2024-01-04 DIAGNOSIS — I10 Essential (primary) hypertension: Secondary | ICD-10-CM | POA: Diagnosis not present

## 2024-01-04 DIAGNOSIS — R7303 Prediabetes: Secondary | ICD-10-CM | POA: Diagnosis not present

## 2024-01-04 DIAGNOSIS — E8889 Other specified metabolic disorders: Secondary | ICD-10-CM | POA: Diagnosis not present
